# Patient Record
Sex: Female | Born: 1980 | Race: White | Hispanic: No | Marital: Single | Smoking: Never smoker
Health system: Southern US, Community
[De-identification: ages and names within clinical notes are randomized; demographics above are authoritative.]

## PROBLEM LIST (undated history)

## (undated) ENCOUNTER — Inpatient Hospital Stay (HOSPITAL_COMMUNITY): Payer: Self-pay

## (undated) DIAGNOSIS — F329 Major depressive disorder, single episode, unspecified: Secondary | ICD-10-CM

## (undated) DIAGNOSIS — I10 Essential (primary) hypertension: Secondary | ICD-10-CM

## (undated) DIAGNOSIS — K219 Gastro-esophageal reflux disease without esophagitis: Secondary | ICD-10-CM

## (undated) DIAGNOSIS — Z98891 History of uterine scar from previous surgery: Secondary | ICD-10-CM

## (undated) DIAGNOSIS — Z8759 Personal history of other complications of pregnancy, childbirth and the puerperium: Secondary | ICD-10-CM

## (undated) DIAGNOSIS — F32A Depression, unspecified: Secondary | ICD-10-CM

## (undated) DIAGNOSIS — O10919 Unspecified pre-existing hypertension complicating pregnancy, unspecified trimester: Secondary | ICD-10-CM

## (undated) DIAGNOSIS — F419 Anxiety disorder, unspecified: Secondary | ICD-10-CM

## (undated) DIAGNOSIS — N200 Calculus of kidney: Secondary | ICD-10-CM

## (undated) DIAGNOSIS — O34219 Maternal care for unspecified type scar from previous cesarean delivery: Secondary | ICD-10-CM

## (undated) HISTORY — DX: Calculus of kidney: N20.0

---

## 2010-03-10 ENCOUNTER — Ambulatory Visit: Payer: Self-pay | Admitting: Advanced Practice Midwife

## 2010-03-10 ENCOUNTER — Observation Stay (HOSPITAL_COMMUNITY): Admission: AD | Admit: 2010-03-10 | Discharge: 2010-03-12 | Payer: Self-pay | Admitting: Obstetrics and Gynecology

## 2010-03-17 ENCOUNTER — Ambulatory Visit: Payer: Self-pay | Admitting: Family Medicine

## 2010-03-20 ENCOUNTER — Ambulatory Visit: Payer: Self-pay | Admitting: Gynecology

## 2010-03-20 ENCOUNTER — Ambulatory Visit: Payer: Self-pay | Admitting: Obstetrics & Gynecology

## 2010-03-20 ENCOUNTER — Inpatient Hospital Stay (HOSPITAL_COMMUNITY): Admission: AD | Admit: 2010-03-20 | Discharge: 2010-03-20 | Payer: Self-pay | Admitting: Obstetrics and Gynecology

## 2010-03-24 ENCOUNTER — Encounter (INDEPENDENT_AMBULATORY_CARE_PROVIDER_SITE_OTHER): Payer: Self-pay | Admitting: Obstetrics and Gynecology

## 2010-03-24 ENCOUNTER — Ambulatory Visit: Payer: Self-pay | Admitting: Obstetrics & Gynecology

## 2010-03-24 ENCOUNTER — Inpatient Hospital Stay (HOSPITAL_COMMUNITY): Admission: AD | Admit: 2010-03-24 | Discharge: 2010-03-28 | Payer: Self-pay | Admitting: Obstetrics and Gynecology

## 2010-07-28 LAB — COMPREHENSIVE METABOLIC PANEL
ALT: 16 U/L (ref 0–35)
AST: 28 U/L (ref 0–37)
Albumin: 1.9 g/dL — ABNORMAL LOW (ref 3.5–5.2)
Albumin: 2.6 g/dL — ABNORMAL LOW (ref 3.5–5.2)
Albumin: 2.7 g/dL — ABNORMAL LOW (ref 3.5–5.2)
Alkaline Phosphatase: 199 U/L — ABNORMAL HIGH (ref 39–117)
BUN: 6 mg/dL (ref 6–23)
BUN: 7 mg/dL (ref 6–23)
CO2: 22 mEq/L (ref 19–32)
CO2: 25 mEq/L (ref 19–32)
Calcium: 8.6 mg/dL (ref 8.4–10.5)
Chloride: 104 mEq/L (ref 96–112)
Chloride: 105 mEq/L (ref 96–112)
Creatinine, Ser: 0.65 mg/dL (ref 0.4–1.2)
Creatinine, Ser: 0.83 mg/dL (ref 0.4–1.2)
GFR calc Af Amer: >60 mL/min (ref >60)
GFR calc non Af Amer: >60 mL/min (ref >60)
GFR calc non Af Amer: >60 mL/min (ref >60)
Glucose, Bld: 73 mg/dL (ref 70–99)
Potassium: 4.4 mEq/L (ref 3.5–5.1)
Sodium: 134 mEq/L — ABNORMAL LOW (ref 135–145)
Total Bilirubin: 0.2 mg/dL — ABNORMAL LOW (ref 0.3–1.2)
Total Bilirubin: 0.3 mg/dL (ref 0.3–1.2)

## 2010-07-28 LAB — CBC
HCT: 34 % — ABNORMAL LOW (ref 36.0–46.0)
Hemoglobin: 11.1 g/dL — ABNORMAL LOW (ref 12.0–15.0)
MCH: 26.6 pg (ref 26.0–34.0)
MCH: 26.8 pg (ref 26.0–34.0)
MCHC: 32.9 g/dL (ref 30.0–36.0)
MCHC: 33.2 g/dL (ref 30.0–36.0)
MCV: 80.2 fL (ref 78.0–100.0)
MCV: 80.7 fL (ref 78.0–100.0)
Platelets: 263 10*3/uL (ref 150–400)
Platelets: 332 10*3/uL (ref 150–400)
RBC: 3.18 MIL/uL — ABNORMAL LOW (ref 3.87–5.11)
RBC: 4.09 MIL/uL (ref 3.87–5.11)
RBC: 4.24 MIL/uL (ref 3.87–5.11)
WBC: 11.6 10*3/uL — ABNORMAL HIGH (ref 4.0–10.5)

## 2010-07-28 LAB — RPR: RPR Ser Ql: NONREACTIVE

## 2010-07-29 LAB — URINALYSIS, ROUTINE W REFLEX MICROSCOPIC
Glucose, UA: NEGATIVE mg/dL
Hgb urine dipstick: NEGATIVE
Ketones, ur: 15 mg/dL — AB
Protein, ur: 100 mg/dL — AB

## 2010-07-29 LAB — URINE MICROSCOPIC-ADD ON

## 2010-07-29 LAB — CBC
Hemoglobin: 11.3 g/dL — ABNORMAL LOW (ref 12.0–15.0)
MCH: 27.2 pg (ref 26.0–34.0)
MCHC: 33.5 g/dL (ref 30.0–36.0)
MCV: 81.3 fL (ref 78.0–100.0)
Platelets: 299 10*3/uL (ref 150–400)
RBC: 4.21 MIL/uL (ref 3.87–5.11)
RDW: 14.1 % (ref 11.5–15.5)
WBC: 11.2 10*3/uL — ABNORMAL HIGH (ref 4.0–10.5)

## 2010-07-29 LAB — CREATININE CLEARANCE, URINE, 24 HOUR
Collection Interval-CRCL: 24 hours
Creatinine, 24H Ur: 1509 mg/d (ref 700–1800)
Creatinine: 0.58 mg/dL (ref 0.4–1.2)
Urine Total Volume-CRCL: 1900 mL

## 2010-07-29 LAB — COMPREHENSIVE METABOLIC PANEL
ALT: 15 U/L (ref 0–35)
ALT: 17 U/L (ref 0–35)
AST: 20 U/L (ref 0–37)
AST: 24 U/L (ref 0–37)
CO2: 21 mEq/L (ref 19–32)
Calcium: 8.7 mg/dL (ref 8.4–10.5)
Chloride: 107 mEq/L (ref 96–112)
GFR calc Af Amer: >60 mL/min (ref >60)
GFR calc Af Amer: >60 mL/min (ref >60)
GFR calc non Af Amer: >60 mL/min (ref >60)
Sodium: 136 mEq/L (ref 135–145)
Sodium: 137 mEq/L (ref 135–145)
Total Bilirubin: 0.6 mg/dL (ref 0.3–1.2)
Total Protein: 5 g/dL — ABNORMAL LOW (ref 6.0–8.3)

## 2010-07-29 LAB — STREP B DNA PROBE: Strep Group B Ag: POSITIVE

## 2010-07-29 LAB — PROTEIN, URINE, 24 HOUR: Protein, Urine: 13 mg/dL

## 2013-10-15 DIAGNOSIS — F32A Depression, unspecified: Secondary | ICD-10-CM | POA: Insufficient documentation

## 2014-12-23 DIAGNOSIS — I10 Essential (primary) hypertension: Secondary | ICD-10-CM | POA: Insufficient documentation

## 2014-12-23 DIAGNOSIS — K219 Gastro-esophageal reflux disease without esophagitis: Secondary | ICD-10-CM | POA: Insufficient documentation

## 2015-05-18 HISTORY — DX: Maternal care for unspecified type scar from previous cesarean delivery: O34.219

## 2015-07-30 DIAGNOSIS — O169 Unspecified maternal hypertension, unspecified trimester: Secondary | ICD-10-CM | POA: Insufficient documentation

## 2015-07-30 DIAGNOSIS — O3429 Maternal care due to uterine scar from other previous surgery: Secondary | ICD-10-CM | POA: Insufficient documentation

## 2015-07-30 LAB — OB RESULTS CONSOLE RUBELLA ANTIBODY, IGM: RUBELLA: IMMUNE

## 2015-07-30 LAB — OB RESULTS CONSOLE ANTIBODY SCREEN: Antibody Screen: NEGATIVE

## 2015-07-30 LAB — OB RESULTS CONSOLE ABO/RH: RH Type: POSITIVE

## 2015-07-30 LAB — OB RESULTS CONSOLE HIV ANTIBODY (ROUTINE TESTING): HIV: NONREACTIVE

## 2015-07-30 LAB — OB RESULTS CONSOLE GC/CHLAMYDIA
CHLAMYDIA, DNA PROBE: NEGATIVE
Gonorrhea: NEGATIVE

## 2015-07-30 LAB — OB RESULTS CONSOLE RPR: RPR: NONREACTIVE

## 2015-07-30 LAB — OB RESULTS CONSOLE HEPATITIS B SURFACE ANTIGEN: HEP B S AG: NEGATIVE

## 2015-11-07 ENCOUNTER — Other Ambulatory Visit (HOSPITAL_COMMUNITY): Payer: Self-pay | Admitting: Obstetrics and Gynecology

## 2015-11-07 DIAGNOSIS — Z3689 Encounter for other specified antenatal screening: Secondary | ICD-10-CM

## 2015-11-12 ENCOUNTER — Encounter (HOSPITAL_COMMUNITY): Payer: Self-pay | Admitting: Obstetrics and Gynecology

## 2015-11-21 ENCOUNTER — Ambulatory Visit (HOSPITAL_COMMUNITY)
Admission: RE | Admit: 2015-11-21 | Discharge: 2015-11-21 | Disposition: A | Discharge status: Home / Self Care | Payer: BLUE CROSS/BLUE SHIELD | Source: Ambulatory Visit | Attending: Obstetrics and Gynecology | Admitting: Obstetrics and Gynecology

## 2015-11-21 ENCOUNTER — Other Ambulatory Visit (HOSPITAL_COMMUNITY): Payer: Self-pay | Admitting: Obstetrics and Gynecology

## 2015-11-21 DIAGNOSIS — Z3A25 25 weeks gestation of pregnancy: Secondary | ICD-10-CM | POA: Diagnosis not present

## 2015-11-21 DIAGNOSIS — Z3689 Encounter for other specified antenatal screening: Secondary | ICD-10-CM

## 2015-11-21 DIAGNOSIS — O10012 Pre-existing essential hypertension complicating pregnancy, second trimester: Secondary | ICD-10-CM

## 2015-11-21 DIAGNOSIS — O09292 Supervision of pregnancy with other poor reproductive or obstetric history, second trimester: Secondary | ICD-10-CM | POA: Diagnosis not present

## 2015-11-21 DIAGNOSIS — Z1389 Encounter for screening for other disorder: Secondary | ICD-10-CM

## 2015-11-21 DIAGNOSIS — O09212 Supervision of pregnancy with history of pre-term labor, second trimester: Secondary | ICD-10-CM | POA: Insufficient documentation

## 2015-11-21 DIAGNOSIS — O359XX0 Maternal care for (suspected) fetal abnormality and damage, unspecified, not applicable or unspecified: Secondary | ICD-10-CM | POA: Insufficient documentation

## 2015-11-21 DIAGNOSIS — Z36 Encounter for antenatal screening of mother: Secondary | ICD-10-CM | POA: Insufficient documentation

## 2015-11-25 ENCOUNTER — Other Ambulatory Visit (HOSPITAL_COMMUNITY): Payer: Self-pay | Admitting: Obstetrics and Gynecology

## 2015-11-25 DIAGNOSIS — Z3689 Encounter for other specified antenatal screening: Secondary | ICD-10-CM

## 2015-12-30 ENCOUNTER — Encounter (HOSPITAL_COMMUNITY): Payer: Self-pay | Admitting: *Deleted

## 2015-12-31 ENCOUNTER — Ambulatory Visit (HOSPITAL_COMMUNITY)
Admission: RE | Admit: 2015-12-31 | Discharge: 2015-12-31 | Disposition: A | Discharge status: Home / Self Care | Payer: BLUE CROSS/BLUE SHIELD | Source: Ambulatory Visit | Attending: Obstetrics and Gynecology | Admitting: Obstetrics and Gynecology

## 2015-12-31 ENCOUNTER — Encounter (HOSPITAL_COMMUNITY): Payer: Self-pay

## 2015-12-31 VITALS — BP 130/89 | HR 121 | Wt 281.2 lb

## 2015-12-31 DIAGNOSIS — Z3A00 Weeks of gestation of pregnancy not specified: Secondary | ICD-10-CM | POA: Diagnosis not present

## 2015-12-31 DIAGNOSIS — O10919 Unspecified pre-existing hypertension complicating pregnancy, unspecified trimester: Secondary | ICD-10-CM | POA: Insufficient documentation

## 2015-12-31 DIAGNOSIS — Z3689 Encounter for other specified antenatal screening: Secondary | ICD-10-CM

## 2015-12-31 HISTORY — DX: Anxiety disorder, unspecified: F41.9

## 2015-12-31 HISTORY — DX: Major depressive disorder, single episode, unspecified: F32.9

## 2015-12-31 HISTORY — DX: Depression, unspecified: F32.A

## 2015-12-31 HISTORY — DX: Essential (primary) hypertension: I10

## 2016-01-28 ENCOUNTER — Encounter (HOSPITAL_COMMUNITY): Payer: Self-pay

## 2016-01-28 ENCOUNTER — Ambulatory Visit (HOSPITAL_COMMUNITY)
Admission: RE | Admit: 2016-01-28 | Discharge: 2016-01-28 | Disposition: A | Discharge status: Home / Self Care | Payer: BLUE CROSS/BLUE SHIELD | Source: Ambulatory Visit | Attending: Obstetrics and Gynecology | Admitting: Obstetrics and Gynecology

## 2016-01-28 DIAGNOSIS — O09213 Supervision of pregnancy with history of pre-term labor, third trimester: Secondary | ICD-10-CM | POA: Insufficient documentation

## 2016-01-28 DIAGNOSIS — Z3A35 35 weeks gestation of pregnancy: Secondary | ICD-10-CM | POA: Insufficient documentation

## 2016-01-28 DIAGNOSIS — O99213 Obesity complicating pregnancy, third trimester: Secondary | ICD-10-CM | POA: Insufficient documentation

## 2016-01-28 DIAGNOSIS — O10919 Unspecified pre-existing hypertension complicating pregnancy, unspecified trimester: Secondary | ICD-10-CM

## 2016-01-28 DIAGNOSIS — O34219 Maternal care for unspecified type scar from previous cesarean delivery: Secondary | ICD-10-CM | POA: Diagnosis not present

## 2016-01-28 DIAGNOSIS — O10013 Pre-existing essential hypertension complicating pregnancy, third trimester: Secondary | ICD-10-CM | POA: Insufficient documentation

## 2016-01-30 LAB — OB RESULTS CONSOLE GBS: GBS: NEGATIVE

## 2016-02-11 ENCOUNTER — Telehealth (HOSPITAL_COMMUNITY): Payer: Self-pay | Admitting: *Deleted

## 2016-02-11 ENCOUNTER — Encounter (HOSPITAL_COMMUNITY): Payer: Self-pay

## 2016-02-11 NOTE — Telephone Encounter (Signed)
Preadmission screen  

## 2016-02-15 ENCOUNTER — Telehealth (HOSPITAL_COMMUNITY): Payer: Self-pay

## 2016-02-15 ENCOUNTER — Inpatient Hospital Stay (EMERGENCY_DEPARTMENT_HOSPITAL)
Admission: AD | Admit: 2016-02-15 | Discharge: 2016-02-15 | Disposition: A | Discharge status: Home / Self Care | Payer: BLUE CROSS/BLUE SHIELD | Source: Ambulatory Visit | Attending: Obstetrics and Gynecology | Admitting: Obstetrics and Gynecology

## 2016-02-15 ENCOUNTER — Encounter (HOSPITAL_COMMUNITY): Payer: Self-pay | Admitting: Certified Nurse Midwife

## 2016-02-15 DIAGNOSIS — Z3A37 37 weeks gestation of pregnancy: Secondary | ICD-10-CM

## 2016-02-15 DIAGNOSIS — O368130 Decreased fetal movements, third trimester, not applicable or unspecified: Secondary | ICD-10-CM

## 2016-02-15 DIAGNOSIS — O368131 Decreased fetal movements, third trimester, fetus 1: Secondary | ICD-10-CM

## 2016-02-15 DIAGNOSIS — O34219 Maternal care for unspecified type scar from previous cesarean delivery: Secondary | ICD-10-CM | POA: Insufficient documentation

## 2016-02-15 DIAGNOSIS — O10013 Pre-existing essential hypertension complicating pregnancy, third trimester: Secondary | ICD-10-CM

## 2016-02-15 DIAGNOSIS — Z7982 Long term (current) use of aspirin: Secondary | ICD-10-CM

## 2016-02-15 DIAGNOSIS — O34211 Maternal care for low transverse scar from previous cesarean delivery: Secondary | ICD-10-CM | POA: Diagnosis not present

## 2016-02-15 NOTE — MAU Note (Signed)
Pt states she hasn't felt her baby move all afternoon. Pt denies ctxs, vaginal bleeding, LOF.

## 2016-02-15 NOTE — MAU Provider Note (Signed)
Chief Complaint:  Decreased Fetal Movement   First Provider Initiated Contact with Patient 02/15/16 1858     HPI: Stacy Richards is a 35 y.o. G2P0102 at 6235w4d who presents to maternity admissions reporting decreased fetal mvmt today. Pregnancy significant for chronic hypertension on Aldomet and previous C-section with plan for repeat at 38 weeks.  Associated signs and symptoms: Negative for abdominal pain, vaginal bleeding, leaking of fluid.  Past Medical History: Past Medical History:  Diagnosis Date  . Anxiety   . Depression   . GERD (gastroesophageal reflux disease)   . History of pre-eclampsia   . Hypertension     Past obstetric history: OB History  Gravida Para Term Preterm AB Living  2 1   1   2   SAB TAB Ectopic Multiple Live Births        1 2    # Outcome Date GA Lbr Len/2nd Weight Sex Delivery Anes PTL Lv  2 Current           1A Preterm 2011 1885w0d  5 lb 2 oz (2.325 kg) F CS-Unspec   LIV  1B Preterm 2011 8185w0d  5 lb 5 oz (2.41 kg) F CS-Unspec   LIV      Past Surgical History: Past Surgical History:  Procedure Laterality Date  . CESAREAN SECTION       Family History: Family History  Problem Relation Age of Onset  . Hypertension Mother   . Hypertension Father   . COPD Paternal Uncle     lung  . Hypertension Maternal Grandmother   . Skin cancer Maternal Grandmother   . Hypertension Maternal Grandfather   . Hypertension Paternal Grandmother   . Skin cancer Paternal Grandmother   . Hypertension Paternal Grandfather   . COPD Paternal Grandfather     lung    Social History: Social History  Substance Use Topics  . Smoking status: Never Smoker  . Smokeless tobacco: Never Used  . Alcohol use No    Allergies: No Known Allergies  Meds:  Prescriptions Prior to Admission  Medication Sig Dispense Refill Last Dose  . aspirin 81 MG tablet Take 81 mg by mouth daily.   Taking  . diphenhydramine-acetaminophen (TYLENOL PM) 25-500 MG TABS tablet Take 2 tablets by  mouth at bedtime as needed (For sleep.).     Marland Kitchen. esomeprazole (NEXIUM) 40 MG capsule Take 40 mg by mouth daily.      . methyldopa (ALDOMET) 250 MG tablet Take 500 mg by mouth 2 (two) times daily.      . Prenatal Vit-Fe Fumarate-FA (PRENATAL MULTIVITAMIN) TABS tablet Take 1 tablet by mouth daily at 12 noon.     . venlafaxine XR (EFFEXOR-XR) 75 MG 24 hr capsule Take 75 mg by mouth 2 (two) times daily.        I have reviewed patient's Past Medical Hx, Surgical Hx, Family Hx, Social Hx, medications and allergies.   ROS:  Review of Systems  Gastrointestinal: Negative for abdominal pain.  Genitourinary: Negative for vaginal bleeding.       Negative for leaking of fluid. Positive for decreased fetal movement.    Physical Exam  Patient Vitals for the past 24 hrs:  BP Temp Temp src Pulse Resp  02/15/16 1840 139/88 - - - -  02/15/16 1837 - 97.9 F (36.6 C) Oral 94 22   Constitutional: Well-developed, well-nourished. obese female in no acute distress.  Cardiovascular: normal rate Respiratory: normal effort GI: Abd soft, non-tender, gravid, size>dates MS: Extremities nontender,  1+ edema, normal ROM Neurologic: Alert and oriented x 4.  GU: Deferred    FHT:  Baseline 155 , moderate variability, accelerations present, no decelerations Contractions: Irregular, painless   Labs: No results found for this or any previous visit (from the past 24 hour(s)).  Imaging:  NA  MAU Course: NST reactive. Patient feeling normal fetal movement while in MAU.  Discussed history, NST with Dr. Senaida Ores. Patient okay for discharge.  MDM: - Decreased fetal movement resolved. NST reactive. Patient has chronic hypertension, normal today on Aldomet.  Assessment: 1. Decreased fetal movement, third trimester, fetus 1   Chronic hypertension pregnancy third trimester  Plan: Discharge home in stable condition per consult with Dr. Senaida Ores.  Labor precautions and fetal kick counts Follow-up Information     [facility] .   Why:  as scheduled or as needed of symptoms worsen Contact information: [address]  [address] [place] [address] [phone]        [facility] .   Why:  as needed if symptoms worsen  Contact information: [address] [id number] [place] [address] [phone]            Medication List    TAKE these medications   aspirin 81 MG tablet Take 81 mg by mouth daily.   diphenhydramine-acetaminophen 25-500 MG Tabs tablet Commonly known as:  TYLENOL PM Take 2 tablets by mouth at bedtime as needed (For sleep.).   esomeprazole 40 MG capsule Commonly known as:  NEXIUM Take 40 mg by mouth daily.   methyldopa 250 MG tablet Commonly known as:  ALDOMET Take 500 mg by mouth 2 (two) times daily.   prenatal multivitamin Tabs tablet Take 1 tablet by mouth daily at 12 noon.   venlafaxine XR 75 MG 24 hr capsule Commonly known as:  EFFEXOR-XR Take 75 mg by mouth 2 (two) times daily.       [place], CNM 02/15/2016 7:24 PM

## 2016-02-15 NOTE — Discharge Instructions (Signed)
Fetal Movement Counts  Patient Name: __________________________________________________ Patient Due Date: ____________________  Performing a fetal movement count is highly recommended in high-risk pregnancies, but it is good for every pregnant woman to do. Your health care provider may ask you to start counting fetal movements at 28 weeks of the pregnancy. Fetal movements often increase:  · After eating a full meal.  · After physical activity.  · After eating or drinking something sweet or cold.  · At rest.  Pay attention to when you feel the baby is most active. This will help you notice a pattern of your baby's sleep and wake cycles and what factors contribute to an increase in fetal movement. It is important to perform a fetal movement count at the same time each day when your baby is normally most active.   HOW TO COUNT FETAL MOVEMENTS  1. Find a quiet and comfortable area to sit or lie down on your left side. Lying on your left side provides the best blood and oxygen circulation to your baby.  2. Write down the day and time on a sheet of paper or in a journal.  3. Start counting kicks, flutters, swishes, rolls, or jabs in a 2-hour period. You should feel at least 10 movements within 2 hours.  4. If you do not feel 10 movements in 2 hours, wait 2-3 hours and count again. Look for a change in the pattern or not enough counts in 2 hours.  SEEK MEDICAL CARE IF:  · You feel less than 10 counts in 2 hours, tried twice.  · There is no movement in over an hour.  · The pattern is changing or taking longer each day to reach 10 counts in 2 hours.  · You feel the baby is not moving as he or she usually does.  Date: ____________ Movements: ____________ Start time: ____________ Finish time: ____________   Date: ____________ Movements: ____________ Start time: ____________ Finish time: ____________  Date: ____________ Movements: ____________ Start time: ____________ Finish time: ____________  Date: ____________ Movements:  ____________ Start time: ____________ Finish time: ____________  Date: ____________ Movements: ____________ Start time: ____________ Finish time: ____________  Date: ____________ Movements: ____________ Start time: ____________ Finish time: ____________  Date: ____________ Movements: ____________ Start time: ____________ Finish time: ____________  Date: ____________ Movements: ____________ Start time: ____________ Finish time: ____________   Date: ____________ Movements: ____________ Start time: ____________ Finish time: ____________  Date: ____________ Movements: ____________ Start time: ____________ Finish time: ____________  Date: ____________ Movements: ____________ Start time: ____________ Finish time: ____________  Date: ____________ Movements: ____________ Start time: ____________ Finish time: ____________  Date: ____________ Movements: ____________ Start time: ____________ Finish time: ____________  Date: ____________ Movements: ____________ Start time: ____________ Finish time: ____________  Date: ____________ Movements: ____________ Start time: ____________ Finish time: ____________   Date: ____________ Movements: ____________ Start time: ____________ Finish time: ____________  Date: ____________ Movements: ____________ Start time: ____________ Finish time: ____________  Date: ____________ Movements: ____________ Start time: ____________ Finish time: ____________  Date: ____________ Movements: ____________ Start time: ____________ Finish time: ____________  Date: ____________ Movements: ____________ Start time: ____________ Finish time: ____________  Date: ____________ Movements: ____________ Start time: ____________ Finish time: ____________  Date: ____________ Movements: ____________ Start time: ____________ Finish time: ____________   Date: ____________ Movements: ____________ Start time: ____________ Finish time: ____________  Date: ____________ Movements: ____________ Start time: ____________ Finish  time: ____________  Date: ____________ Movements: ____________ Start time: ____________ Finish time: ____________  Date: ____________ Movements: ____________ Start time:   ____________ Finish time: ____________  Date: ____________ Movements: ____________ Start time: ____________ Finish time: ____________  Date: ____________ Movements: ____________ Start time: ____________ Finish time: ____________  Date: ____________ Movements: ____________ Start time: ____________ Finish time: ____________   Date: ____________ Movements: ____________ Start time: ____________ Finish time: ____________  Date: ____________ Movements: ____________ Start time: ____________ Finish time: ____________  Date: ____________ Movements: ____________ Start time: ____________ Finish time: ____________  Date: ____________ Movements: ____________ Start time: ____________ Finish time: ____________  Date: ____________ Movements: ____________ Start time: ____________ Finish time: ____________  Date: ____________ Movements: ____________ Start time: ____________ Finish time: ____________  Date: ____________ Movements: ____________ Start time: ____________ Finish time: ____________   Date: ____________ Movements: ____________ Start time: ____________ Finish time: ____________  Date: ____________ Movements: ____________ Start time: ____________ Finish time: ____________  Date: ____________ Movements: ____________ Start time: ____________ Finish time: ____________  Date: ____________ Movements: ____________ Start time: ____________ Finish time: ____________  Date: ____________ Movements: ____________ Start time: ____________ Finish time: ____________  Date: ____________ Movements: ____________ Start time: ____________ Finish time: ____________  Date: ____________ Movements: ____________ Start time: ____________ Finish time: ____________   Date: ____________ Movements: ____________ Start time: ____________ Finish time: ____________  Date: ____________  Movements: ____________ Start time: ____________ Finish time: ____________  Date: ____________ Movements: ____________ Start time: ____________ Finish time: ____________  Date: ____________ Movements: ____________ Start time: ____________ Finish time: ____________  Date: ____________ Movements: ____________ Start time: ____________ Finish time: ____________  Date: ____________ Movements: ____________ Start time: ____________ Finish time: ____________  Date: ____________ Movements: ____________ Start time: ____________ Finish time: ____________   Date: ____________ Movements: ____________ Start time: ____________ Finish time: ____________  Date: ____________ Movements: ____________ Start time: ____________ Finish time: ____________  Date: ____________ Movements: ____________ Start time: ____________ Finish time: ____________  Date: ____________ Movements: ____________ Start time: ____________ Finish time: ____________  Date: ____________ Movements: ____________ Start time: ____________ Finish time: ____________  Date: ____________ Movements: ____________ Start time: ____________ Finish time: ____________     This information is not intended to replace advice given to you by your health care provider. Make sure you discuss any questions you have with your health care provider.     Document Released: 06/02/2006 Document Revised: 05/24/2014 Document Reviewed: 02/28/2012  Elsevier Interactive Patient Education ©2016 [employer] Inc[employer]

## 2016-02-17 ENCOUNTER — Encounter (HOSPITAL_COMMUNITY): Payer: Self-pay | Admitting: Obstetrics and Gynecology

## 2016-02-17 ENCOUNTER — Encounter (HOSPITAL_COMMUNITY)
Admission: RE | Admit: 2016-02-17 | Discharge: 2016-02-17 | Disposition: A | Discharge status: Home / Self Care | Payer: BLUE CROSS/BLUE SHIELD | Source: Ambulatory Visit | Attending: Obstetrics and Gynecology | Admitting: Obstetrics and Gynecology

## 2016-02-17 DIAGNOSIS — O10919 Unspecified pre-existing hypertension complicating pregnancy, unspecified trimester: Secondary | ICD-10-CM

## 2016-02-17 DIAGNOSIS — O34219 Maternal care for unspecified type scar from previous cesarean delivery: Secondary | ICD-10-CM

## 2016-02-17 HISTORY — DX: Unspecified pre-existing hypertension complicating pregnancy, unspecified trimester: O10.919

## 2016-02-17 HISTORY — DX: Personal history of other complications of pregnancy, childbirth and the puerperium: Z87.59

## 2016-02-17 HISTORY — DX: Gastro-esophageal reflux disease without esophagitis: K21.9

## 2016-02-17 LAB — BASIC METABOLIC PANEL
ANION GAP: 8 (ref 5–15)
BUN: 7 mg/dL (ref 6–20)
CALCIUM: 9.3 mg/dL (ref 8.9–10.3)
CO2: 24 mmol/L (ref 22–32)
CREATININE: 0.73 mg/dL (ref 0.44–1.00)
Chloride: 104 mmol/L (ref 101–111)
Glucose, Bld: 79 mg/dL (ref 65–99)
Potassium: 4.5 mmol/L (ref 3.5–5.1)
Sodium: 136 mmol/L (ref 135–145)

## 2016-02-17 LAB — CBC
HEMATOCRIT: 33.1 % — AB (ref 36.0–46.0)
HEMOGLOBIN: 10.8 g/dL — AB (ref 12.0–15.0)
MCH: 26.3 pg (ref 26.0–34.0)
MCHC: 32.6 g/dL (ref 30.0–36.0)
MCV: 80.5 fL (ref 78.0–100.0)
Platelets: 347 10*3/uL (ref 150–400)
RBC: 4.11 MIL/uL (ref 3.87–5.11)
RDW: 13.9 % (ref 11.5–15.5)
WBC: 8.9 10*3/uL (ref 4.0–10.5)

## 2016-02-17 LAB — ABO/RH: ABO/RH(D): A POS

## 2016-02-17 NOTE — H&P (Signed)
Stacy Richards is a 35 y.o. 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  Also  h/o LTCS - Otherwise pregnancy complicated by maternal obesity and depression/anxiety on Effexor..   pt desires rLTCS d/w pt r/b/a   OB History    Gravida Para Term Preterm AB Living   2 1   1   2    SAB TAB Ectopic Multiple Live Births         1 2    G1 twins - 36wk LTCS, br/br.  Also LSO for  G2 present No abn pap, last 11/16 No STD  Past Medical History:  Diagnosis Date  . Anxiety   . Depression   . GERD (gastroesophageal reflux disease)   . History of cesarean section complicating pregnancy 02/17/2016  . History of pre-eclampsia   . HTN in pregnancy, chronic 02/17/2016  . Hypertension    Past Surgical History:  Procedure Laterality Date  . CESAREAN SECTION    with LSO for dermoid.    History: family history includes COPD in her paternal grandfather and paternal uncle; Hypertension in her father, maternal grandfather, maternal grandmother, mother, paternal grandfather, and paternal grandmother; Skin cancer in her maternal grandmother and paternal grandmother. Social History:  reports that she has never smoked. She has never used smokeless tobacco. She reports that she does not drink alcohol or use drugs. engaged, SAHM Meds esompeprazole, methyldopa, effexor and PNV All NKDA     Maternal Diabetes: No Genetic Screening: Declined Maternal Ultrasounds/Referrals: Normal Fetal Ultrasounds or other Referrals:  None Maternal Substance Abuse:  No Significant Maternal Medications:  Meds include: Other: Effexor, Esompeprazole Significant Maternal Lab Results:  Lab values include: Group B Strep negative Other Comments:  h/o PIH,   Review of Systems  Constitutional: Negative.   Eyes: Negative.   Respiratory: Negative.   Cardiovascular: Negative.   Gastrointestinal: Negative.   Genitourinary: Negative.   Musculoskeletal: Positive for back pain.  Skin:  Negative.   Neurological: Positive for headaches (occasional; h/o PIH).  Psychiatric/Behavioral: Negative.    Maternal Medical History:  Contractions: Frequency: irregular.   Perceived severity is moderate.    Fetal activity: Perceived fetal activity is normal.    Prenatal complications: PIH.   Prenatal Complications - Diabetes: none.      Last menstrual period 05/28/2015. Maternal Exam:  Uterine Assessment: Contraction frequency is irregular.   Abdomen: Patient reports no abdominal tenderness. Surgical scars: low transverse.   Fundal height is appropriate for gestation.   Estimated fetal weight is 8#.   Fetal presentation: vertex  Introitus: Normal vulva. Normal vagina.    Physical Exam  Constitutional: She is oriented to person, place, and time. She appears well-developed and well-nourished.  obese  HENT:  Head: Normocephalic and atraumatic.  Cardiovascular: Normal rate and regular rhythm.   Respiratory: Effort normal and breath sounds normal. No respiratory distress. She has no wheezes.  GI: Soft. Bowel sounds are normal. She exhibits no distension. There is no tenderness.  Musculoskeletal: Normal range of motion.  Neurological: She is alert and oriented to person, place, and time.  Skin: Skin is warm and dry.  Psychiatric: She has a normal mood and affect. Her behavior is normal.    Prenatal labs: ABO, Rh: --/--/A POS (10/03 1010) Antibody: NEG (10/03 1010) Rubella: Immune (03/15 0000) RPR: Nonreactive (03/15 0000)  HBsAg: Negative (03/15 0000)  HIV: Non-reactive (03/15 0000)  GBS: Negative (09/15 0000)   Hgb 12.1/Plt 414/Ur Cx neg/GC  neg/Chl neg/ CF neg/SMA neg/Fragile X neg/glucola 108  Multiple [place] - good growth, nl anat, post plac, female Assessment/Plan: 34yo G2P0102 at 38+ for rLTCS given h/o LTCS and CHTN/PIH D/w pt rLTCS, inc r/b/a Ancef for prophylaxis    Bovard-Stuckert, Elyzabeth Goatley 02/17/2016, 3:33 PM

## 2016-02-17 NOTE — Patient Instructions (Signed)
20 [name]  02/17/2016   Your procedure is scheduled on:  02/18/2016  [facility] at0800 AM.  Pick up the phone at the desk and dial [phone].   Call this number if you have problems the morning of surgery: [phone]   Remember:   Do not eat food:After Midnight.  Do not drink clear liquids: After Midnight.  Take these medicines the morning of surgery with A SIP OF WATER: take effexor, aldomet and nexium   Do not wear jewelry, make-up or nail polish.  Do not wear lotions, powders, or perfumes. Do not wear deodorant.  Do not shave 48 hours prior to surgery.  Do not bring valuables to the hospital.  [facility] is not   responsible for any belongings or valuables brought to the hospital.  Contacts, dentures or bridgework may not be worn into surgery.  Leave suitcase in the car. After surgery it may be brought to your room.  For patients admitted to the hospital, checkout time is 11:00 AM the day of              discharge.   Patients discharged the day of surgery will not be allowed to drive             home.  Name and phone number of your driver: none  Special Instructions:   N/A   Please read over the following fact sheets that you were given:   Surgical Site Infection Prevention

## 2016-02-18 ENCOUNTER — Inpatient Hospital Stay (HOSPITAL_COMMUNITY): Payer: BLUE CROSS/BLUE SHIELD | Admitting: Anesthesiology

## 2016-02-18 ENCOUNTER — Inpatient Hospital Stay (HOSPITAL_COMMUNITY)
Admission: RE | Admit: 2016-02-18 | Discharge: 2016-02-20 | DRG: 765 | Disposition: A | Discharge status: Home / Self Care | Payer: BLUE CROSS/BLUE SHIELD | Source: Ambulatory Visit | Attending: Obstetrics and Gynecology | Admitting: Obstetrics and Gynecology

## 2016-02-18 ENCOUNTER — Encounter (HOSPITAL_COMMUNITY)
Admission: RE | Disposition: A | Discharge status: Home / Self Care | Payer: Self-pay | Source: Ambulatory Visit | Attending: Obstetrics and Gynecology

## 2016-02-18 ENCOUNTER — Encounter (HOSPITAL_COMMUNITY): Payer: Self-pay

## 2016-02-18 DIAGNOSIS — O34211 Maternal care for low transverse scar from previous cesarean delivery: Principal | ICD-10-CM | POA: Diagnosis present

## 2016-02-18 DIAGNOSIS — O10919 Unspecified pre-existing hypertension complicating pregnancy, unspecified trimester: Secondary | ICD-10-CM | POA: Diagnosis present

## 2016-02-18 DIAGNOSIS — Z825 Family history of asthma and other chronic lower respiratory diseases: Secondary | ICD-10-CM

## 2016-02-18 DIAGNOSIS — O34219 Maternal care for unspecified type scar from previous cesarean delivery: Secondary | ICD-10-CM | POA: Diagnosis present

## 2016-02-18 DIAGNOSIS — Z98891 History of uterine scar from previous surgery: Secondary | ICD-10-CM

## 2016-02-18 DIAGNOSIS — K219 Gastro-esophageal reflux disease without esophagitis: Secondary | ICD-10-CM | POA: Diagnosis present

## 2016-02-18 DIAGNOSIS — O9962 Diseases of the digestive system complicating childbirth: Secondary | ICD-10-CM | POA: Diagnosis present

## 2016-02-18 DIAGNOSIS — O1092 Unspecified pre-existing hypertension complicating childbirth: Secondary | ICD-10-CM | POA: Diagnosis present

## 2016-02-18 DIAGNOSIS — Z3A38 38 weeks gestation of pregnancy: Secondary | ICD-10-CM | POA: Diagnosis not present

## 2016-02-18 HISTORY — DX: History of uterine scar from previous surgery: Z98.891

## 2016-02-18 HISTORY — DX: Unspecified pre-existing hypertension complicating pregnancy, unspecified trimester: O10.919

## 2016-02-18 LAB — PREPARE RBC (CROSSMATCH)

## 2016-02-18 LAB — RPR: RPR: NONREACTIVE

## 2016-02-18 SURGERY — Surgical Case
Anesthesia: Spinal

## 2016-02-18 MED ORDER — PANTOPRAZOLE SODIUM 40 MG PO TBEC
40.0000 mg | DELAYED_RELEASE_TABLET | Freq: Every day | ORAL | Status: DC
  Administered 2016-02-19: 40 mg via ORAL
  Filled 2016-02-18 (×2): qty 1

## 2016-02-18 MED ORDER — DEXAMETHASONE SODIUM PHOSPHATE 10 MG/ML IJ SOLN
INTRAMUSCULAR | Status: AC
  Filled 2016-02-18: qty 1

## 2016-02-18 MED ORDER — OXYTOCIN 10 UNIT/ML IJ SOLN
INTRAVENOUS | Status: DC | PRN
  Administered 2016-02-18: 40 [IU] via INTRAVENOUS

## 2016-02-18 MED ORDER — PHENYLEPHRINE 8 MG IN D5W 100 ML (0.08MG/ML) PREMIX OPTIME
INJECTION | INTRAVENOUS | Status: DC | PRN
  Administered 2016-02-18: 40 ug/min via INTRAVENOUS

## 2016-02-18 MED ORDER — SIMETHICONE 80 MG PO CHEW
80.0000 mg | CHEWABLE_TABLET | Freq: Three times a day (TID) | ORAL | Status: DC
  Administered 2016-02-18 – 2016-02-20 (×5): 80 mg via ORAL
  Filled 2016-02-18 (×5): qty 1

## 2016-02-18 MED ORDER — ONDANSETRON HCL 4 MG/2ML IJ SOLN
INTRAMUSCULAR | Status: DC | PRN
  Administered 2016-02-18: 4 mg via INTRAVENOUS

## 2016-02-18 MED ORDER — DIPHENHYDRAMINE HCL 25 MG PO CAPS: 25.0000 mg | ORAL_CAPSULE | Freq: Four times a day (QID) | ORAL | Status: DC | PRN

## 2016-02-18 MED ORDER — CEFAZOLIN SODIUM 10 G IJ SOLR
3.0000 g | INTRAMUSCULAR | Status: AC
  Administered 2016-02-18: 3 g via INTRAVENOUS
  Filled 2016-02-18: qty 3000

## 2016-02-18 MED ORDER — LACTATED RINGERS IV SOLN: INTRAVENOUS | Status: DC

## 2016-02-18 MED ORDER — BUPIVACAINE IN DEXTROSE 0.75-8.25 % IT SOLN
INTRATHECAL | Status: DC | PRN
  Administered 2016-02-18: 1.6 mL via INTRATHECAL

## 2016-02-18 MED ORDER — KETOROLAC TROMETHAMINE 30 MG/ML IJ SOLN
INTRAMUSCULAR | Status: AC
  Filled 2016-02-18: qty 1

## 2016-02-18 MED ORDER — SIMETHICONE 80 MG PO CHEW
80.0000 mg | CHEWABLE_TABLET | ORAL | Status: DC
  Administered 2016-02-18 – 2016-02-19 (×2): 80 mg via ORAL
  Filled 2016-02-18 (×2): qty 1

## 2016-02-18 MED ORDER — OXYTOCIN 40 UNITS IN LACTATED RINGERS INFUSION - SIMPLE MED: 2.5000 [IU]/h | INTRAVENOUS | Status: AC

## 2016-02-18 MED ORDER — FENTANYL CITRATE (PF) 100 MCG/2ML IJ SOLN
INTRAMUSCULAR | Status: AC
  Filled 2016-02-18: qty 2

## 2016-02-18 MED ORDER — MORPHINE SULFATE-NACL 0.5-0.9 MG/ML-% IV SOSY
PREFILLED_SYRINGE | INTRAVENOUS | Status: AC
  Filled 2016-02-18: qty 1

## 2016-02-18 MED ORDER — PROMETHAZINE HCL 25 MG/ML IJ SOLN: 6.2500 mg | INTRAMUSCULAR | Status: DC | PRN

## 2016-02-18 MED ORDER — TETANUS-DIPHTH-ACELL PERTUSSIS 5-2.5-18.5 LF-MCG/0.5 IM SUSP: 0.5000 mL | Freq: Once | INTRAMUSCULAR | Status: DC

## 2016-02-18 MED ORDER — SCOPOLAMINE 1 MG/3DAYS TD PT72
MEDICATED_PATCH | TRANSDERMAL | Status: AC
  Administered 2016-02-18: 1.5 mg via TRANSDERMAL
  Filled 2016-02-18: qty 1

## 2016-02-18 MED ORDER — DEXAMETHASONE SODIUM PHOSPHATE 10 MG/ML IJ SOLN
INTRAMUSCULAR | Status: DC | PRN
  Administered 2016-02-18: 10 mg via INTRAVENOUS

## 2016-02-18 MED ORDER — SENNOSIDES-DOCUSATE SODIUM 8.6-50 MG PO TABS
2.0000 | ORAL_TABLET | ORAL | Status: DC
  Administered 2016-02-18: 2 via ORAL
  Filled 2016-02-18 (×2): qty 2

## 2016-02-18 MED ORDER — NALOXONE HCL 0.4 MG/ML IJ SOLN: 0.4000 mg | INTRAMUSCULAR | Status: DC | PRN

## 2016-02-18 MED ORDER — SODIUM CHLORIDE 0.9% FLUSH: 3.0000 mL | INTRAVENOUS | Status: DC | PRN

## 2016-02-18 MED ORDER — DIPHENHYDRAMINE HCL 50 MG/ML IJ SOLN: 12.5000 mg | INTRAMUSCULAR | Status: DC | PRN

## 2016-02-18 MED ORDER — METHYLDOPA 500 MG PO TABS
500.0000 mg | ORAL_TABLET | Freq: Two times a day (BID) | ORAL | Status: DC
  Administered 2016-02-18 – 2016-02-20 (×4): 500 mg via ORAL
  Filled 2016-02-18 (×4): qty 1

## 2016-02-18 MED ORDER — PHENYLEPHRINE 8 MG IN D5W 100 ML (0.08MG/ML) PREMIX OPTIME
INJECTION | INTRAVENOUS | Status: AC
  Filled 2016-02-18: qty 100

## 2016-02-18 MED ORDER — IBUPROFEN 800 MG PO TABS
800.0000 mg | ORAL_TABLET | Freq: Three times a day (TID) | ORAL | Status: DC
  Administered 2016-02-18 – 2016-02-20 (×5): 800 mg via ORAL
  Filled 2016-02-18 (×5): qty 1

## 2016-02-18 MED ORDER — MEPERIDINE HCL 25 MG/ML IJ SOLN: 6.2500 mg | INTRAMUSCULAR | Status: DC | PRN

## 2016-02-18 MED ORDER — HYDROMORPHONE HCL 1 MG/ML IJ SOLN: 0.2500 mg | INTRAMUSCULAR | Status: DC | PRN

## 2016-02-18 MED ORDER — PRENATAL MULTIVITAMIN CH
1.0000 | ORAL_TABLET | Freq: Every day | ORAL | Status: DC
  Filled 2016-02-18: qty 1

## 2016-02-18 MED ORDER — LACTATED RINGERS IV SOLN
INTRAVENOUS | Status: DC
  Administered 2016-02-18 (×3): via INTRAVENOUS

## 2016-02-18 MED ORDER — WITCH HAZEL-GLYCERIN EX PADS: 1.0000 "application " | MEDICATED_PAD | CUTANEOUS | Status: DC | PRN

## 2016-02-18 MED ORDER — KETOROLAC TROMETHAMINE 30 MG/ML IJ SOLN
30.0000 mg | Freq: Four times a day (QID) | INTRAMUSCULAR | Status: DC | PRN
  Administered 2016-02-18: 30 mg via INTRAMUSCULAR

## 2016-02-18 MED ORDER — DIBUCAINE 1 % RE OINT: 1.0000 "application " | TOPICAL_OINTMENT | RECTAL | Status: DC | PRN

## 2016-02-18 MED ORDER — LACTATED RINGERS IV SOLN
INTRAVENOUS | Status: DC
  Administered 2016-02-18: 21:00:00 via INTRAVENOUS

## 2016-02-18 MED ORDER — DIPHENHYDRAMINE HCL 25 MG PO CAPS: 25.0000 mg | ORAL_CAPSULE | ORAL | Status: DC | PRN

## 2016-02-18 MED ORDER — COCONUT OIL OIL: 1.0000 "application " | TOPICAL_OIL | Status: DC | PRN

## 2016-02-18 MED ORDER — SCOPOLAMINE 1 MG/3DAYS TD PT72
1.0000 | MEDICATED_PATCH | Freq: Once | TRANSDERMAL | Status: DC
  Administered 2016-02-18: 1.5 mg via TRANSDERMAL

## 2016-02-18 MED ORDER — NALOXONE HCL 2 MG/2ML IJ SOSY
1.0000 ug/kg/h | PREFILLED_SYRINGE | INTRAMUSCULAR | Status: DC | PRN
  Filled 2016-02-18: qty 2

## 2016-02-18 MED ORDER — SCOPOLAMINE 1 MG/3DAYS TD PT72
1.0000 | MEDICATED_PATCH | Freq: Once | TRANSDERMAL | Status: DC
  Filled 2016-02-18: qty 1

## 2016-02-18 MED ORDER — NALBUPHINE HCL 10 MG/ML IJ SOLN: 5.0000 mg | INTRAMUSCULAR | Status: DC | PRN

## 2016-02-18 MED ORDER — SIMETHICONE 80 MG PO CHEW: 80.0000 mg | CHEWABLE_TABLET | ORAL | Status: DC | PRN

## 2016-02-18 MED ORDER — NALBUPHINE HCL 10 MG/ML IJ SOLN: 5.0000 mg | Freq: Once | INTRAMUSCULAR | Status: DC | PRN

## 2016-02-18 MED ORDER — LACTATED RINGERS IV SOLN
INTRAVENOUS | Status: DC | PRN
  Administered 2016-02-18: 10:00:00 via INTRAVENOUS

## 2016-02-18 MED ORDER — ZOLPIDEM TARTRATE 5 MG PO TABS: 5.0000 mg | ORAL_TABLET | Freq: Every evening | ORAL | Status: DC | PRN

## 2016-02-18 MED ORDER — OXYCODONE HCL 5 MG PO TABS: 10.0000 mg | ORAL_TABLET | ORAL | Status: DC | PRN

## 2016-02-18 MED ORDER — VENLAFAXINE HCL ER 75 MG PO CP24
75.0000 mg | ORAL_CAPSULE | Freq: Two times a day (BID) | ORAL | Status: DC
  Administered 2016-02-18 – 2016-02-20 (×4): 75 mg via ORAL
  Filled 2016-02-18 (×4): qty 1

## 2016-02-18 MED ORDER — FENTANYL CITRATE (PF) 100 MCG/2ML IJ SOLN
INTRAMUSCULAR | Status: DC | PRN
  Administered 2016-02-18 (×2): 50 ug via INTRAVENOUS

## 2016-02-18 MED ORDER — ONDANSETRON HCL 4 MG/2ML IJ SOLN: 4.0000 mg | Freq: Three times a day (TID) | INTRAMUSCULAR | Status: DC | PRN

## 2016-02-18 MED ORDER — ONDANSETRON HCL 4 MG/2ML IJ SOLN
INTRAMUSCULAR | Status: AC
  Filled 2016-02-18: qty 2

## 2016-02-18 MED ORDER — OXYTOCIN 10 UNIT/ML IJ SOLN
INTRAMUSCULAR | Status: AC
  Filled 2016-02-18: qty 4

## 2016-02-18 MED ORDER — ACETAMINOPHEN 325 MG PO TABS
650.0000 mg | ORAL_TABLET | ORAL | Status: DC | PRN
  Administered 2016-02-18 – 2016-02-19 (×2): 650 mg via ORAL
  Filled 2016-02-18 (×2): qty 2

## 2016-02-18 MED ORDER — MORPHINE SULFATE (PF) 0.5 MG/ML IJ SOLN
INTRAMUSCULAR | Status: DC | PRN
  Administered 2016-02-18: .2 mg via EPIDURAL
  Administered 2016-02-18: .3 mg via INTRAVENOUS

## 2016-02-18 MED ORDER — MENTHOL 3 MG MT LOZG: 1.0000 | LOZENGE | OROMUCOSAL | Status: DC | PRN

## 2016-02-18 MED ORDER — KETOROLAC TROMETHAMINE 30 MG/ML IJ SOLN: 30.0000 mg | Freq: Four times a day (QID) | INTRAMUSCULAR | Status: DC | PRN

## 2016-02-18 MED ORDER — LACTATED RINGERS IV SOLN
Freq: Once | INTRAVENOUS | Status: AC
  Administered 2016-02-18: 1000 mL/h via INTRAVENOUS

## 2016-02-18 MED ORDER — OXYCODONE HCL 5 MG PO TABS
5.0000 mg | ORAL_TABLET | ORAL | Status: DC | PRN
  Administered 2016-02-19 – 2016-02-20 (×3): 5 mg via ORAL
  Filled 2016-02-18 (×3): qty 1

## 2016-02-18 SURGICAL SUPPLY — 39 items
BENZOIN TINCTURE PRP APPL 2/3 (GAUZE/BANDAGES/DRESSINGS) ×3 IMPLANT
CHLORAPREP W/TINT 26ML (MISCELLANEOUS) ×3 IMPLANT
CLAMP CORD UMBIL (MISCELLANEOUS) IMPLANT
CLOSURE WOUND 1/2 X4 (GAUZE/BANDAGES/DRESSINGS) ×1
CLOTH BEACON ORANGE TIMEOUT ST (SAFETY) ×3 IMPLANT
CONTAINER PREFILL 10% NBF 15ML (MISCELLANEOUS) IMPLANT
DRSG OPSITE POSTOP 4X10 (GAUZE/BANDAGES/DRESSINGS) ×3 IMPLANT
ELECT REM PT RETURN 9FT ADLT (ELECTROSURGICAL) ×3
ELECTRODE REM PT RTRN 9FT ADLT (ELECTROSURGICAL) ×1 IMPLANT
EXTRACTOR VACUUM M CUP 4 TUBE (SUCTIONS) IMPLANT
EXTRACTOR VACUUM M CUP 4' TUBE (SUCTIONS)
GLOVE BIO SURGEON STRL SZ 6.5 (GLOVE) ×2 IMPLANT
GLOVE BIO SURGEONS STRL SZ 6.5 (GLOVE) ×1
GLOVE BIOGEL PI IND STRL 7.0 (GLOVE) ×1 IMPLANT
GLOVE BIOGEL PI INDICATOR 7.0 (GLOVE) ×2
GOWN STRL REUS W/TWL LRG LVL3 (GOWN DISPOSABLE) ×6 IMPLANT
KIT ABG SYR 3ML LUER SLIP (SYRINGE) IMPLANT
NEEDLE HYPO 25X5/8 SAFETYGLIDE (NEEDLE) IMPLANT
NS IRRIG 1000ML POUR BTL (IV SOLUTION) ×3 IMPLANT
PACK C SECTION WH (CUSTOM PROCEDURE TRAY) ×3 IMPLANT
PAD OB MATERNITY 4.3X12.25 (PERSONAL CARE ITEMS) ×3 IMPLANT
PENCIL SMOKE EVAC W/HOLSTER (ELECTROSURGICAL) ×3 IMPLANT
RTRCTR C-SECT PINK 25CM LRG (MISCELLANEOUS) ×3 IMPLANT
SPONGE LAP 18X18 X RAY DECT (DISPOSABLE) ×6 IMPLANT
STRIP CLOSURE SKIN 1/2X4 (GAUZE/BANDAGES/DRESSINGS) ×2 IMPLANT
SUT MNCRL 0 VIOLET CTX 36 (SUTURE) ×3 IMPLANT
SUT MONOCRYL 0 CTX 36 (SUTURE) ×6
SUT PLAIN 1 NONE 54 (SUTURE) IMPLANT
SUT PLAIN 2 0 XLH (SUTURE) ×3 IMPLANT
SUT VIC AB 0 CT1 27 (SUTURE) ×4
SUT VIC AB 0 CT1 27XBRD ANBCTR (SUTURE) ×2 IMPLANT
SUT VIC AB 2-0 CT1 27 (SUTURE) ×2
SUT VIC AB 2-0 CT1 TAPERPNT 27 (SUTURE) ×1 IMPLANT
SUT VIC AB 3-0 SH 27 (SUTURE) ×2
SUT VIC AB 3-0 SH 27X BRD (SUTURE) ×1 IMPLANT
SUT VIC AB 4-0 KS 27 (SUTURE) ×3 IMPLANT
SYR BULB IRRIGATION 50ML (SYRINGE) ×3 IMPLANT
TOWEL OR 17X24 6PK STRL BLUE (TOWEL DISPOSABLE) ×3 IMPLANT
TRAY FOLEY CATH SILVER 14FR (SET/KITS/TRAYS/PACK) ×3 IMPLANT

## 2016-02-18 NOTE — Op Note (Deleted)
  The note originally documented on this encounter has been moved the the encounter in which it belongs.  

## 2016-02-18 NOTE — Progress Notes (Signed)
MOB was referred for history of depression/anxiety. * Referral screened out by Clinical Social Worker because none of the following criteria appear to apply: ~ History of anxiety/depression during this pregnancy, or of post-partum depression. ~ Diagnosis of anxiety and/or depression within last 3 years OR * MOB's symptoms currently being treated with medication and/or therapy. Please contact the Clinical Social Worker if needs arise, or if MOB requests.  MOB has been taking Effexor prenatally.  Blaine HamperAngel Boyd-Gilyard, MSW, LCSW Clinical Social Work [phone]

## 2016-02-18 NOTE — Anesthesia Postprocedure Evaluation (Signed)
Anesthesia Post Note  Patient: Stacy Richards  Procedure(s) Performed: Procedure(s) (LRB): CESAREAN SECTION (N/A)  Patient location during evaluation: PACU Anesthesia Type: Spinal and MAC Level of consciousness: awake and alert Pain management: pain level controlled Vital Signs Assessment: post-procedure vital signs reviewed and stable Respiratory status: spontaneous breathing and respiratory function stable Cardiovascular status: blood pressure returned to baseline and stable Postop Assessment: spinal receding Anesthetic complications: no     Last Vitals:  Vitals:   02/18/16 1305 02/18/16 1323  BP:  138/89  Pulse: (!) 103 95  Resp: 20 18  Temp:  36.4 C    Last Pain:  Vitals:   02/18/16 1345  TempSrc:   PainSc: 2    Pain Goal: Patients Stated Pain Goal: 0 (02/18/16 1103)               Lewie LoronJohn Tiffany Talarico

## 2016-02-18 NOTE — Op Note (Signed)
NAMEKIMBERLYE, DILGER NO.:  [id number]  MEDICAL RECORD NO.:  [id number]  LOCATION:  ZO10                          FACILITY:  [facility]  PHYSICIAN:  Sherron Monday, MD        DATE OF BIRTH:  03-14-1981  DATE OF PROCEDURE:  02/18/2016 DATE OF DISCHARGE:  02/20/2016                              OPERATIVE REPORT   PREOPERATIVE DIAGNOSIS:  Intrauterine pregnancy at 38+ weeks.  History of low-transverse cesarean section, declined VBAC, history of pregnancy- induced hypertension, with chronic hypertension and worsening blood pressures for repeat low-transverse cesarean section.  POSTOPERATIVE DIAGNOSIS:  Intrauterine pregnancy at 38+ weeks.  History of low-transverse cesarean section, declined VBAC, history of pregnancy- induced hypertension, with chronic hypertension and worsening blood pressures for repeat low-transverse cesarean section, delivered.  PROCEDURE:  Repeat low-transverse cesarean section.  ASSISTANT:  Webb Silversmith, RNFA.  ANESTHESIA:  Spiral.  FINDINGS:  Viable female infant at 9:54 a.m. with Apgars of 9 at 1 minute and 9 at 5 minutes and the weight pending at the time of dictation.  Normal right tube and ovary.  Left adnexa with minimal adhesions with history of LSo\O, normal postpartum uterus with some uterine to peritoneal adhesions.  EBL:  Approximately 700 mL.  URINE OUTPUT:  150 mL, clear urine at the end of the procedure.  IV FLUIDS:  3100 mL.  COMPLICATIONS:  None.  PATHOLOGY:  Placenta to L and D.  DESCRIPTION OF PROCEDURE:  After informed consent was reviewed with the patient including risks, benefits, alternatives of surgical procedure, she was taken to the OR, where spinal anesthesia was placed and found to be adequate.  She was then returned to the supine position with a leftward tilt.  Prepped and draped in the normal sterile fashion.  A Foley catheter was sterilely placed.  A Pfannenstiel skin incision was made at the level of  her previous incision, carried through the underlying layer of fascia sharply.  Fascia was incised in the midline. The incision was extended laterally with Mayo scissors.  Superior aspect of the fascial incision was grasped with Kocher clamps, elevated, and rectus muscles were dissected off both bluntly and sharply.  Midline was easily identified and the peritoneum was entered bluntly.  The peritoneal incision was extended superiorly and inferiorly with good visualization of the bladder.  On entry into the peritoneum, fine adhesions were lysed manually.  The Alexis retractor was placed with some difficulty given the adhesions.  The uterus was incised in transverse fashion.  Infant was delivered from a vertex presentation. Nose and mouth were suctioned on the field.  After a minute, the cord was clamped and cut.  Infant was handed off to the waiting pediatric staff.  The placenta was expressed from the uterus.  Given to cord blood collection, the uterus was cleared of all clot and debris.  The uterine incision was closed with 0 Monocryl in 2 layers, the first of which was running locked and the second as an imbricating layer.  The gutters were cleared of all clot and debris.  The incision was made hemostatic at the left aspect.  There had been minimal bleeding due to the  layers not coming together smoothly.  The fascia was reapproximated with 0 Vicryl from either end overlapping in the midline.  The subcuticular adipose layer was made hemostatic with Bovie cautery.  The dead space was closed with 3-0 plain gut.  After the uterus was closed with 2 layers, the gutters were cleared of all clot and debris.  The peritoneum was reapproximated with 2-0 Vicryl in a running fashion.  Then, the fascia was closed with 2 sutures of 0 Vicryl overlapping in the midline.  The subcuticular adipose layer was made hemostatic with Bovie cautery.  The dead space was closed with 3-0 plain gut.  The skin was  closed with 4-0 Vicryl, repeat of the [facility].  Benzoin and Steri-Strips applied. The patient tolerated the procedure well.  Sponge, lap, and needle count were correct x2 per the operating staff.     Sherron MondayJody Bovard, MD     JB/MEDQ  D:  02/18/2016  T:  02/18/2016  Job:  [id number]

## 2016-02-18 NOTE — Transfer of Care (Signed)
Immediate Anesthesia Transfer of Care Note  Patient: Stacy Richards  Procedure(s) Performed: Procedure(s): CESAREAN SECTION (N/A)  Patient Location: PACU  Anesthesia Type:Spinal  Level of Consciousness: awake, alert  and oriented  Airway & Oxygen Therapy: Patient Spontanous Breathing  Post-op Assessment: Report given to RN  Post vital signs: Reviewed and stable  Last Vitals:  Vitals:   02/18/16 0827  BP: (!) 146/107  Pulse: (!) 105  Resp: 16  Temp: 36.8 C    Last Pain:  Vitals:   02/18/16 0827  TempSrc: Oral      Patients Stated Pain Goal: 3 (02/18/16 0827)  Complications: No apparent anesthesia complications

## 2016-02-18 NOTE — Interval H&P Note (Signed)
History and Physical Interval Note:  02/18/2016 9:01 AM  Stacy Richards  has presented today for surgery, with the diagnosis of Repeat C-Section  The various methods of treatment have been discussed with the patient and family. After consideration of risks, benefits and other options for treatment, the patient has consented to  Procedure(s): CESAREAN SECTION (N/A) as a surgical intervention .  The patient's history has been reviewed, patient examined, no change in status, stable for surgery.  I have reviewed the patient's chart and labs.  Questions were answered to the patient's satisfaction.     Bovard-Stuckert, Zoriah Pulice

## 2016-02-18 NOTE — Anesthesia Procedure Notes (Signed)
Spinal  Patient location during procedure: OR Staffing Anesthesiologist: Nolon Nations Performed: anesthesiologist  Preanesthetic Checklist Completed: patient identified, site marked, surgical consent, pre-op evaluation, timeout performed, IV checked, risks and benefits discussed and monitors and equipment checked Spinal Block Patient position: sitting Prep: Betadine Patient monitoring: heart rate, continuous pulse ox and blood pressure Location: L3-4 Injection technique: single-shot Needle Needle type: Sprotte  Needle gauge: 24 G Needle length: 9 cm Additional Notes Expiration date of kit checked and confirmed. Patient tolerated procedure well, without complications.

## 2016-02-18 NOTE — Progress Notes (Signed)
Patient ID: Stacy Richards, female   DOB: 09/24/1980, 35 y.o.   MRN: [id number] POD#0 Pt doing well post c/s No headache, fever or abnl bleeding Pain controlled with meds.  Bonding well with baby Routine pp/post op care

## 2016-02-18 NOTE — Brief Op Note (Signed)
02/18/2016  10:55 AM  PATIENT:  Stacy Richards  35 y.o. female  PRE-OPERATIVE DIAGNOSIS:  Repeat C-Section, history of LTCS, h/o PIH, CHTN  POST-OPERATIVE DIAGNOSIS:  Repeat C-Section, history of chronic hypertension, PIH, delivered  PROCEDURE:  Procedure(s): CESAREAN SECTION (N/A) repeat  SURGEON:  Surgeon(s) and Role:    * Sherian ReinJody Bovard-Stuckert, MD - Primary  ASSISTANTS: Webb SilversmithKrietemeyer, Heather, RNFA   ANESTHESIA:   spinal   FINDINGS: viable female infant at 9:54am Stacy Richards(Stacy Richards) apgars 9/9 at 1 and 5 minutes; wt P; nl Right tube and ovary.  Nl PP uterus - some adhesion (uterine to peritoneum)  EBL:  Total I/O In: 3100 [I.V.:3100] Out: 850 [Urine:150; Blood:700]  BLOOD ADMINISTERED:none  DRAINS: Urinary Catheter (Foley)   LOCAL MEDICATIONS USED:  NONE  SPECIMEN:  Source of Specimen:  Placenta  DISPOSITION OF SPECIMEN:  L&D  COUNTS:  YES  TOURNIQUET:  * No tourniquets in log *  DICTATION: .Other Dictation: Dictation Number [id number]  PLAN OF CARE: Admit to inpatient   PATIENT DISPOSITION:  PACU - hemodynamically stable.   Delay start of Pharmacological VTE agent (>24hrs) due to surgical blood loss or risk of bleeding: not applicable

## 2016-02-18 NOTE — Lactation Note (Signed)
This note was copied from a baby's chart. Lactation Consultation Note  Patient Name: Stacy Henreitta LeberJanae Nicosia ZOXWR'UToday's Date: 02/18/2016 Reason for consult: Initial assessment   Initial assessment at 1 hour of age in PACU. Mom is a repeat c/s. She was not able to latch her 33 yo twins who were born at 5736 w [address]. Mom wishes to BF this infant.   Infant was STS with mom when mom brought into PACU. She was rooting. Mom with large compressible breasts and areolar areas. Nipples are large and everted. Showed mom how to hand express, colostrum was visible and easy to express from both breasts. Enc mom to hand express prior to feeding and after feeding to apply EBM to nipples.   Latched infant to right breast in the laid back position. Infant latched eagerly with rhythmic suckles and intermittent swallows. Infant fed for 20 minutes and then was latched to left breast in laid back position. Mom reported a lot of pain with latch that did improve with repositioning and relatching. Infant fed for 10 minutes and then fell asleep. Infant oral structure examined due to nipple pain and was benign. She was noted to have good extension and elevation of tongue. Mom was pleased that infant fed well.   BF Basics, supply and demand, Cluster feeding, colostrum, milk coming to volume, positioning, and hand expression discussed. Enc mom to feed 8-12 x in 24 hours at first feeding cues. She will need to be shown football and cradle holds, they were discussed with mom. Advised mom to use good pillow support with feedings. Mom reports she has a Boppy Pillow with her.   BF Resources Handout and LC Brochure given, mom informed of IP/OP Services, BF Support Groups and LC phone #. Enc mom to call out for her RN if feeding assistance is needed. Mom reports she has a Spectra 2 pump at home. Follow up later today and prn.      Maternal Data Formula Feeding for Exclusion: No Has patient been taught Hand Expression?: Yes Does the patient have  breastfeeding experience prior to this delivery?: Yes  Feeding Feeding Type: Breast Fed Length of feed: 30 min  LATCH Score/Interventions Latch: Grasps breast easily, tongue down, lips flanged, rhythmical sucking.  Audible Swallowing: Spontaneous and intermittent  Type of Nipple: Everted at rest and after stimulation  Comfort (Breast/Nipple): Filling, red/small blisters or bruises, mild/mod discomfort  Problem noted: Mild/Moderate discomfort Interventions (Mild/moderate discomfort): Hand expression (Deepen latch)  Hold (Positioning): Full assist, staff holds infant at breast  LATCH Score: 7  Lactation Tools Discussed/Used WIC Program: No   Consult Status Consult Status: Follow-up Date: 02/18/16 Follow-up type: In-patient    Silas FloodSharon S Elayne Gruver 02/18/2016, 11:41 AM

## 2016-02-18 NOTE — Anesthesia Preprocedure Evaluation (Addendum)
Anesthesia Evaluation  Patient identified by MRN, date of birth, ID band Patient awake    Reviewed: Allergy & Precautions, NPO status , Patient's Chart, lab work & pertinent test results  Airway Mallampati: III  TM Distance: >3 FB Neck ROM: Full    Dental no notable dental hx.    Pulmonary neg pulmonary ROS,    Pulmonary exam normal breath sounds clear to auscultation       Cardiovascular hypertension, Normal cardiovascular exam Rhythm:Regular Rate:Normal     Neuro/Psych PSYCHIATRIC DISORDERS Anxiety Depression negative neurological ROS     GI/Hepatic Neg liver ROS, GERD  ,  Endo/Other  Morbid obesity  Renal/GU negative Renal ROS     Musculoskeletal negative musculoskeletal ROS (+)   Abdominal (+) + obese,   Peds  Hematology negative hematology ROS (+)   Anesthesia Other Findings   Reproductive/Obstetrics negative OB ROS                            Anesthesia Physical Anesthesia Plan  ASA: III  Anesthesia Plan: Spinal   Post-op Pain Management:    Induction:   Airway Management Planned:   Additional Equipment:   Intra-op Plan:   Post-operative Plan:   Informed Consent: I have reviewed the patients History and Physical, chart, labs and discussed the procedure including the risks, benefits and alternatives for the proposed anesthesia with the patient or authorized representative who has indicated his/her understanding and acceptance.   Dental advisory given  Plan Discussed with: CRNA  Anesthesia Plan Comments:       Anesthesia Quick Evaluation

## 2016-02-19 LAB — CBC
HCT: 28.9 % — ABNORMAL LOW (ref 36.0–46.0)
HEMOGLOBIN: 9.4 g/dL — AB (ref 12.0–15.0)
MCH: 26.3 pg (ref 26.0–34.0)
MCHC: 32.5 g/dL (ref 30.0–36.0)
MCV: 80.7 fL (ref 78.0–100.0)
PLATELETS: 376 10*3/uL (ref 150–400)
RBC: 3.58 MIL/uL — ABNORMAL LOW (ref 3.87–5.11)
RDW: 14.1 % (ref 11.5–15.5)
WBC: 13.8 10*3/uL — AB (ref 4.0–10.5)

## 2016-02-19 LAB — BIRTH TISSUE RECOVERY COLLECTION (PLACENTA DONATION)

## 2016-02-19 NOTE — Addendum Note (Signed)
Addendum  created 02/19/16 0739 by Earmon PhoenixValerie P Tyja Gortney, CRNA   Sign clinical note

## 2016-02-19 NOTE — Progress Notes (Signed)
Subjective: Postpartum Day 1: Cesarean Delivery Patient reports nausea, tolerating PO and + BM.    Objective: Vital signs in last 24 hours: Temp:  [97.4 F (36.3 C)-98.4 F (36.9 C)] 98.1 F (36.7 C) (10/05 0500) Pulse Rate:  [69-103] 100 (10/05 0500) Resp:  [13-27] 18 (10/05 0500) BP: (123-150)/(68-100) 143/88 (10/05 0500) SpO2:  [81 %-100 %] 95 % (10/04 1730)  Physical Exam:  General: alert and cooperative Lochia: appropriate Uterine Fundus: firm Incision: C/D/I    Recent Labs  02/17/16 1010 02/19/16 0526  HGB 10.8* 9.4*  HCT 33.1* 28.9*    Assessment/Plan: Status post Cesarean section. Doing well postoperatively.  Continue current care.  Oliver PilaRICHARDSON,Preslyn Warr W 02/19/2016, 8:44 AM

## 2016-02-19 NOTE — Anesthesia Postprocedure Evaluation (Signed)
Anesthesia Post Note  Patient: Stacy Richards  Procedure(s) Performed: Procedure(s) (LRB): CESAREAN SECTION (N/A)  Patient location during evaluation: Mother Baby Anesthesia Type: Spinal Level of consciousness: awake Pain management: pain level controlled Vital Signs Assessment: post-procedure vital signs reviewed and stable Respiratory status: spontaneous breathing Cardiovascular status: stable Postop Assessment: no headache, no backache, spinal receding and patient able to bend at knees Anesthetic complications: no     Last Vitals:  Vitals:   02/19/16 0130 02/19/16 0500  BP: 133/82 (!) 143/88  Pulse: 82 100  Resp: 18 18  Temp: 36.8 C 36.7 C    Last Pain:  Vitals:   02/19/16 0500  TempSrc: Oral  PainSc: 0-No pain   Pain Goal: Patients Stated Pain Goal: 0 (02/18/16 1103)               Edison PaceWILKERSON,Weylin Plagge

## 2016-02-20 MED ORDER — OXYCODONE HCL 5 MG PO TABS: 5.0000 mg | ORAL_TABLET | Freq: Four times a day (QID) | ORAL | 0 refills | Status: DC | PRN

## 2016-02-20 MED ORDER — IBUPROFEN 800 MG PO TABS: 800.0000 mg | ORAL_TABLET | Freq: Three times a day (TID) | ORAL | 1 refills | Status: DC | PRN

## 2016-02-20 MED ORDER — PRENATAL MULTIVITAMIN CH: 1.0000 | ORAL_TABLET | Freq: Every day | ORAL | 3 refills | Status: DC

## 2016-02-20 NOTE — Discharge Summary (Signed)
OB Discharge Summary     Patient Name: Stacy Richards DOB: 1981/01/23 MRN: [id number]  Date of admission: 02/18/2016 Delivering MD: Sherian Rein   Date of discharge: 02/20/2016  Admitting diagnosis: Repeat C-Section Intrauterine pregnancy: [redacted]w[redacted]d     Secondary diagnosis:  Principal Problem:   S/P cesarean section Active Problems:   HTN in pregnancy, chronic   History of cesarean section complicating pregnancy  Additional problems: none      Discharge diagnosis: Term Pregnancy Delivered                                                                                                Post partum procedures:none  Augmentation: N/A  Complications: None  Hospital course:  Sceduled C/S   35 y.o. yo G2P1103 at [redacted]w[redacted]d was admitted to the hospital 02/18/2016 for scheduled cesarean section with the following indication:Elective Repeat.  Membrane Rupture Time/Date: 9:53 AM ,02/18/2016   Patient delivered a Viable infant.02/18/2016  Details of operation can be found in separate operative note.  Pateint had an uncomplicated postpartum course.  She is ambulating, tolerating a regular diet, passing flatus, and urinating well. Patient is discharged home in stable condition on  02/20/16          Physical exam Vitals:   02/19/16 0500 02/19/16 0920 02/19/16 1831 02/20/16 0624  BP: (!) 143/88 139/85 123/74 (!) 142/101  Pulse: 100 (!) 108 98 82  Resp: 18 18 18 16   Temp: 98.1 F (36.7 C) 98 F (36.7 C) 97.8 F (36.6 C) 97.9 F (36.6 C)  TempSrc: Oral Oral Oral Oral  SpO2:  99%     General: alert and no distress Lochia: appropriate Uterine Fundus: firm Incision: Healing well with no significant drainage DVT Evaluation: No evidence of DVT seen on physical exam. Labs: Lab Results  Component Value Date   WBC 13.8 (H) 02/19/2016   HGB 9.4 (L) 02/19/2016   HCT 28.9 (L) 02/19/2016   MCV 80.7 02/19/2016   PLT 376 02/19/2016   CMP Latest Ref Rng & Units 02/17/2016  Glucose 65 - 99  mg/dL 79  BUN 6 - 20 mg/dL 7  Creatinine 7.82 - 9.56 mg/dL 2.13  Sodium 086 - 578 mmol/L 136  Potassium 3.5 - 5.1 mmol/L 4.5  Chloride 101 - 111 mmol/L 104  CO2 22 - 32 mmol/L 24  Calcium 8.9 - 10.3 mg/dL 9.3  Total Protein 6.0 - 8.3 g/dL -  Total Bilirubin 0.3 - 1.2 mg/dL -  Alkaline Phos 39 - 469 U/L -  AST 0 - 37 U/L -  ALT 0 - 35 U/L -    Discharge instruction: per After Visit Summary and "Baby and Me Booklet".  After visit meds:    Medication List    STOP taking these medications   aspirin 81 MG tablet   diphenhydramine-acetaminophen 25-500 MG Tabs tablet Commonly known as:  TYLENOL PM     TAKE these medications   esomeprazole 40 MG capsule Commonly known as:  NEXIUM Take 40 mg by mouth daily.   ibuprofen 800 MG tablet Commonly known as:  ADVIL,MOTRIN Take 1  tablet (800 mg total) by mouth every 8 (eight) hours as needed.   methyldopa 250 MG tablet Commonly known as:  ALDOMET Take 500 mg by mouth 2 (two) times daily.   oxyCODONE 5 MG immediate release tablet Commonly known as:  Oxy IR/ROXICODONE Take 1 tablet (5 mg total) by mouth every 6 (six) hours as needed (pain scale 4-7). Take 1-2 tablets po q 6 hours prn severe pain   prenatal multivitamin Tabs tablet Take 1 tablet by mouth daily at 12 noon.   venlafaxine XR 75 MG 24 hr capsule Commonly known as:  EFFEXOR-XR Take 75 mg by mouth 2 (two) times daily.       Diet: routine diet  Activity: Advance as tolerated. Pelvic rest for 6 weeks.   Outpatient follow up:2 and 6 weeks Follow up Appt:No future appointments. Follow up Visit:No Follow-up on file.  Postpartum contraception: IUD Mirena  Newborn Data: Live born female  Birth Weight: 7 lb 14.6 oz (3590 g) APGAR: 9, 9  Baby Feeding: Breast Disposition:home with mother   02/20/2016 Sherian ReinBovard-Stuckert, Donia Yokum, MD

## 2016-02-20 NOTE — Progress Notes (Addendum)
Subjective: Postpartum Day 2: Cesarean Delivery Patient reports incisional pain, tolerating PO and no problems voiding.  Pain controlled, nl lochia  Objective: Vital signs in last 24 hours: Temp:  [97.8 F (36.6 C)-98 F (36.7 C)] 97.9 F (36.6 C) (10/06 0624) Pulse Rate:  [82-108] 82 (10/06 0624) Resp:  [16-18] 16 (10/06 0624) BP: (123-142)/(74-101) 142/101 (10/06 0624) SpO2:  [99 %] 99 % (10/05 0920)  Physical Exam:  General: alert and no distress Lochia: appropriate Uterine Fundus: firm Incision: healing well DVT Evaluation: No evidence of DVT seen on physical exam.   Recent Labs  02/17/16 1010 02/19/16 0526  HGB 10.8* 9.4*  HCT 33.1* 28.9*    Assessment/Plan: Status post Cesarean section. Doing well postoperatively.  Continue current care.  Discharge home with motrin and Percocet and PNV,  F/u 2 weeks and 6 weeks  Richards, Stacy Sarin 02/20/2016, 8:24 AM

## 2016-02-20 NOTE — Lactation Note (Signed)
This note was copied from a baby's chart. Lactation Consultation Note: Mom reports baby has been nursing well- only a little pain with initial latch that then eases off after a few seconds. Reports baby last fed about 45 min ago for 10 min. Too sleepy at present- would not latch. Tried to breast fed her twins that are now 33 years old but they were premature and milk never came in. LS 9 by RN. Reports she had a lot of help from her RN Johnny BridgeMartha yesterday. Has Medela pump for home. Reviewed engorgement prevention and treatment. No questions at present. Reviewed our phone number, OP appointments and BFSG as resources for support after DC.   Patient Name: Stacy Richards ZOXWR'UToday's Date: 02/20/2016 Reason for consult: Follow-up assessment   Maternal Data Formula Feeding for Exclusion: No Has patient been taught Hand Expression?: Yes Does the patient have breastfeeding experience prior to this delivery?: Yes  Feeding Feeding Type: Breast Fed  LATCH Score/Interventions Latch: Too sleepy or reluctant, no latch achieved, no sucking elicited.  Audible Swallowing: None  Type of Nipple: Flat  Comfort (Breast/Nipple): Soft / non-tender     Hold (Positioning): No assistance needed to correctly position infant at breast.  LATCH Score: 5  Lactation Tools Discussed/Used WIC Program: No   Consult Status Consult Status: Complete    Pamelia HoitWeeks, Makeba Delcastillo D 02/20/2016, 11:00 AM

## 2016-02-21 LAB — TYPE AND SCREEN
ABO/RH(D): A POS
ANTIBODY SCREEN: NEGATIVE
UNIT DIVISION: 0
Unit division: 0

## 2016-03-17 ENCOUNTER — Telehealth (HOSPITAL_COMMUNITY): Payer: Self-pay | Admitting: Lactation Services

## 2016-03-17 NOTE — Telephone Encounter (Signed)
Returned mom's call in regards to wanting to rent a pump. Gave information on Monthly pump rentals and pump kit costs. Mom reports she may purchase another single Spectra to use vs DEBP rental. Discussed that if she is having milk supply issues a Hospital Grade DEBP may work better for her.   Mom reports that infant has in the last few days not seemed satisfied at the breast. She reports infant is feeding every 2 hours during the day and every 3-4 at night. She reports infant is voiding and stooling more than every feeding. She reports infants diaper is fitting more snug than it has been.She reports she was told by Ped to begin taking Fenugreek and to pump post BF. She reports she is taking 3 capsules of Fenugreek 3 times a day and is pumping for 10 minutes post BF with a single Spectra pump. She reports that she is getting a very small amount post pumping and has collected an ounce today. Advised mom that if there is concern with milk supply, all EBM should be offered to infant post BF. Mom reports her breast do feel fuller today and that she is getting a little bit more with pumping. Discussed normalcy of growth spurts in babies and a lot of what she is describing sounds like a normal growth spurt.   She asked if she needed to pump at night, advised she could pump post every BF or power pump at night before bed and pump post BF during day time feeds. Enc her to increase pumping times to 15-20 minutes. Shared information in Mother Love More Milk plus to use in place of Fenugreek if mom does not see an increase in volume in the next day or so. Enc mom to relax with pumping and to not watch the pump  Infant is to have a weight check next week at [facility] office. Enc mom to call back with further questions/concerns. Mom reports she is glad she called and feels better about what is going on.

## 2017-02-22 DIAGNOSIS — E785 Hyperlipidemia, unspecified: Secondary | ICD-10-CM | POA: Insufficient documentation

## 2017-08-30 ENCOUNTER — Ambulatory Visit: Payer: Self-pay | Admitting: Dietician

## 2017-09-09 NOTE — Telephone Encounter (Signed)
To close encounter

## 2017-10-03 ENCOUNTER — Encounter: Payer: Self-pay | Admitting: Dietician

## 2017-10-03 NOTE — Progress Notes (Signed)
Have not heard back from patient to reschedule her missed appointment from 08/30/17. Sent letter to referring provider.

## 2018-11-27 DIAGNOSIS — E669 Obesity, unspecified: Secondary | ICD-10-CM | POA: Insufficient documentation

## 2019-08-02 ENCOUNTER — Ambulatory Visit: Payer: Medicaid Other | Attending: Internal Medicine

## 2019-08-02 DIAGNOSIS — Z23 Encounter for immunization: Secondary | ICD-10-CM

## 2019-08-02 NOTE — Progress Notes (Signed)
   Covid-19 Vaccination Clinic  Name:  LANYA BUCKS    MRN: [id number] DOB: April 18, 1981  08/02/2019  Ms. Caudell was observed post Covid-19 immunization for 15 minutes without incident. She was provided with Vaccine Information Sheet and instruction to access the V-Safe system.   Ms. Saine was instructed to call 911 with any severe reactions post vaccine: Marland Kitchen Difficulty breathing  . Swelling of face and throat  . A fast heartbeat  . A bad rash all over body  . Dizziness and weakness   Immunizations Administered    Name Date Dose VIS Date Route   Pfizer COVID-19 Vaccine 08/02/2019 10:47 AM 0.3 mL 04/27/2019 Intramuscular   Manufacturer: [employer], [employer]   Lot: [id number]   NDC: [address]-8

## 2019-08-27 ENCOUNTER — Ambulatory Visit: Payer: Medicaid Other | Attending: Internal Medicine

## 2019-08-27 DIAGNOSIS — Z23 Encounter for immunization: Secondary | ICD-10-CM

## 2019-08-27 NOTE — Progress Notes (Signed)
   Covid-19 Vaccination Clinic  Name:  Stacy Richards    MRN: [id number] DOB: 05/05/1981  08/27/2019  Stacy Richards was observed post Covid-19 immunization for 15 minutes without incident. She was provided with Vaccine Information Sheet and instruction to access the V-Safe system.   Stacy Richards was instructed to call 911 with any severe reactions post vaccine: Marland Kitchen Difficulty breathing  . Swelling of face and throat  . A fast heartbeat  . A bad rash all over body  . Dizziness and weakness   Immunizations Administered    Name Date Dose VIS Date Route   Pfizer COVID-19 Vaccine 08/27/2019  1:40 PM 0.3 mL 04/27/2019 Intramuscular   Manufacturer: [employer], [employer]   Lot: [id number]   NDC: [address]-1

## 2019-09-01 ENCOUNTER — Emergency Department
Admission: EM | Admit: 2019-09-01 | Discharge: 2019-09-01 | Disposition: A | Discharge status: Home / Self Care | Payer: BLUE CROSS/BLUE SHIELD | Attending: Emergency Medicine | Admitting: Emergency Medicine

## 2019-09-01 ENCOUNTER — Other Ambulatory Visit: Payer: Self-pay

## 2019-09-01 ENCOUNTER — Encounter: Payer: Self-pay | Admitting: Emergency Medicine

## 2019-09-01 DIAGNOSIS — Z79899 Other long term (current) drug therapy: Secondary | ICD-10-CM | POA: Insufficient documentation

## 2019-09-01 DIAGNOSIS — I10 Essential (primary) hypertension: Secondary | ICD-10-CM | POA: Diagnosis not present

## 2019-09-01 DIAGNOSIS — N390 Urinary tract infection, site not specified: Secondary | ICD-10-CM | POA: Insufficient documentation

## 2019-09-01 DIAGNOSIS — R1031 Right lower quadrant pain: Secondary | ICD-10-CM | POA: Diagnosis present

## 2019-09-01 LAB — COMPREHENSIVE METABOLIC PANEL
ALT: 21 U/L (ref 0–44)
AST: 15 U/L (ref 15–41)
Albumin: 4 g/dL (ref 3.5–5.0)
Alkaline Phosphatase: 92 U/L (ref 38–126)
Anion gap: 9 (ref 5–15)
BUN: 14 mg/dL (ref 6–20)
CO2: 26 mmol/L (ref 22–32)
Calcium: 8.7 mg/dL — ABNORMAL LOW (ref 8.9–10.3)
Chloride: 100 mmol/L (ref 98–111)
Creatinine, Ser: 0.8 mg/dL (ref 0.44–1.00)
GFR calc Af Amer: >60 mL/min (ref >60)
GFR calc non Af Amer: >60 mL/min (ref >60)
Glucose, Bld: 98 mg/dL (ref 70–99)
Potassium: 4.2 mmol/L (ref 3.5–5.1)
Sodium: 135 mmol/L (ref 135–145)
Total Bilirubin: 0.7 mg/dL (ref 0.3–1.2)
Total Protein: 7.6 g/dL (ref 6.5–8.1)

## 2019-09-01 LAB — URINALYSIS, COMPLETE (UACMP) WITH MICROSCOPIC
Bilirubin Urine: NEGATIVE
Glucose, UA: NEGATIVE mg/dL
Ketones, ur: NEGATIVE mg/dL
Nitrite: NEGATIVE
Protein, ur: NEGATIVE mg/dL
Specific Gravity, Urine: 1.015 (ref 1.005–1.030)
pH: 7 (ref 5.0–8.0)

## 2019-09-01 LAB — LIPASE, BLOOD: Lipase: 30 U/L (ref 11–51)

## 2019-09-01 LAB — CBC
HCT: 40.1 % (ref 36.0–46.0)
Hemoglobin: 13 g/dL (ref 12.0–15.0)
MCH: 27.3 pg (ref 26.0–34.0)
MCHC: 32.4 g/dL (ref 30.0–36.0)
MCV: 84.2 fL (ref 80.0–100.0)
Platelets: 359 10*3/uL (ref 150–400)
RBC: 4.76 MIL/uL (ref 3.87–5.11)
RDW: 14.4 % (ref 11.5–15.5)
WBC: 8.4 10*3/uL (ref 4.0–10.5)
nRBC: 0 % (ref 0.0–0.2)

## 2019-09-01 LAB — POCT PREGNANCY, URINE: Preg Test, Ur: NEGATIVE

## 2019-09-01 MED ORDER — CEPHALEXIN 500 MG PO CAPS: 500.0000 mg | ORAL_CAPSULE | Freq: Two times a day (BID) | ORAL | 0 refills | Status: DC

## 2019-09-01 MED ORDER — ONDANSETRON HCL 4 MG/2ML IJ SOLN
4.0000 mg | Freq: Once | INTRAMUSCULAR | Status: AC
  Administered 2019-09-01: 09:00:00 4 mg via INTRAVENOUS
  Filled 2019-09-01: qty 2

## 2019-09-01 MED ORDER — KETOROLAC TROMETHAMINE 30 MG/ML IJ SOLN
30.0000 mg | Freq: Once | INTRAMUSCULAR | Status: AC
  Administered 2019-09-01: 09:00:00 30 mg via INTRAVENOUS
  Filled 2019-09-01: qty 1

## 2019-09-01 NOTE — ED Provider Notes (Signed)
[facility] Emergency Department Provider Note   ____________________________________________    I have reviewed the triage vital signs and the nursing notes.   HISTORY  Chief Complaint Abdominal Pain and Back Pain     HPI Stacy Richards is a 39 y.o. female with a history as noted below who presents with complaint of right lower quadrant abdominal pain.  Patient reports a dull aching sensation for nearly a week which worsened significantly today.  She describes as a moderate pain.  Does not radiate except occasionally into her back.  No history of kidney stones.  No dysuria.  No hematuria.  Has not taken anything for this.  Mild nausea.  No vaginal bleeding.  Does have a Mirena IUD.  Past Medical History:  Diagnosis Date  . Anxiety   . Depression   . GERD (gastroesophageal reflux disease)   . History of cesarean section complicating pregnancy 15/08/84  . History of pre-eclampsia   . HTN in pregnancy, chronic 02/17/2016  . Hypertension   . S/P cesarean section 02/18/2016    Patient Active Problem List   Diagnosis Date Noted  . S/P cesarean section 02/18/2016  . HTN in pregnancy, chronic 02/17/2016  . History of cesarean section complicating pregnancy 76/19/5093    Past Surgical History:  Procedure Laterality Date  . CESAREAN SECTION    . CESAREAN SECTION N/A 02/18/2016   Procedure: CESAREAN SECTION;  Surgeon: Janyth Contes, MD;  Location: [facility];  Service: Obstetrics;  Laterality: N/A;    Prior to Admission medications   Medication Sig Start Date End Date Taking? Authorizing Provider  cephALEXin (KEFLEX) 500 MG capsule Take 1 capsule (500 mg total) by mouth 2 (two) times daily. 09/01/19   Lavonia Drafts, MD  esomeprazole (NEXIUM) 40 MG capsule Take 40 mg by mouth daily.  08/01/15   [provider]  ibuprofen (ADVIL,MOTRIN) 800 MG tablet Take 1 tablet (800 mg total) by mouth every 8 (eight) hours as needed. 02/20/16    Bovard-Stuckert, Jeral Fruit, MD  methyldopa (ALDOMET) 250 MG tablet Take 500 mg by mouth 2 (two) times daily.  12/17/15   [provider]  oxyCODONE (OXY IR/ROXICODONE) 5 MG immediate release tablet Take 1 tablet (5 mg total) by mouth every 6 (six) hours as needed (pain scale 4-7). Take 1-2 tablets po q 6 hours prn severe pain 02/20/16   Bovard-Stuckert, Jody, MD  Prenatal Vit-Fe Fumarate-FA (PRENATAL MULTIVITAMIN) TABS tablet Take 1 tablet by mouth daily at 12 noon. 02/20/16   Bovard-Stuckert, Jeral Fruit, MD  venlafaxine XR (EFFEXOR-XR) 75 MG 24 hr capsule Take 75 mg by mouth 2 (two) times daily.  01/20/16   [provider]     Allergies Patient has no known allergies.  Family History  Problem Relation Age of Onset  . Hypertension Mother   . Hypertension Father   . COPD Paternal Uncle        lung  . Hypertension Maternal Grandmother   . Skin cancer Maternal Grandmother   . Hypertension Maternal Grandfather   . Hypertension Paternal Grandmother   . Skin cancer Paternal Grandmother   . Hypertension Paternal Grandfather   . COPD Paternal Grandfather        lung    Social History Social History   Tobacco Use  . Smoking status: Never Smoker  . Smokeless tobacco: Never Used  Substance Use Topics  . Alcohol use: No  . Drug use: No    Review of Systems  Constitutional: No  fever/chills Eyes: No visual changes.  ENT: No sore throat. Cardiovascular: Denies chest pain. Respiratory: Denies shortness of breath. Gastrointestinal: As above Genitourinary: As above, no vaginal discharge Musculoskeletal: Negative for back pain. Skin: Negative for rash. Neurological: Negative for headache   ____________________________________________   PHYSICAL EXAM:  VITAL SIGNS: ED Triage Vitals  Enc Vitals Group     BP 09/01/19 0903 (!) 141/87     Pulse Rate 09/01/19 0903 91     Resp 09/01/19 0903 18     Temp 09/01/19 0903 98.3 F (36.8 C)     Temp Source 09/01/19 0903 Oral     SpO2  09/01/19 0903 99 %     Weight 09/01/19 0900 124.7 kg (275 lb)     Height 09/01/19 0900 1.651 m (5\' 5" )     Head Circumference --      Peak Flow --      Pain Score 09/01/19 0859 6     Pain Loc --      Pain Edu? --      Excl. in GC? --     Constitutional: Alert and oriented.   Nose: No congestion/rhinnorhea. Mouth/Throat: Mucous membranes are moist.    Cardiovascular: Normal rate, regular rhythm.   Good peripheral circulation. Respiratory: Normal respiratory effort.  No retractions. Gastrointestinal: Mild tenderness palpation the right lower quadrant, abdomen is soft. No distention.  No CVA tenderness. Genitourinary: deferred Musculoskeletal:   Warm and well perfused Neurologic:  Normal speech and language. No gross focal neurologic deficits are appreciated.  Skin:  Skin is warm, dry and intact. No rash noted. Psychiatric: Mood and affect are normal. Speech and behavior are normal.  ____________________________________________   LABS (all labs ordered are listed, but only abnormal results are displayed)  Labs Reviewed  COMPREHENSIVE METABOLIC PANEL - Abnormal; Notable for the following components:      Result Value   Calcium 8.7 (*)    All other components within normal limits  URINALYSIS, COMPLETE (UACMP) WITH MICROSCOPIC - Abnormal; Notable for the following components:   Color, Urine YELLOW (*)    APPearance HAZY (*)    Hgb urine dipstick SMALL (*)    Leukocytes,Ua SMALL (*)    Bacteria, UA MANY (*)    All other components within normal limits  CBC  LIPASE, BLOOD  POCT PREGNANCY, URINE   ____________________________________________  EKG  None ____________________________________________  RADIOLOGY   ____________________________________________   PROCEDURES  Procedure(s) performed: No  Procedures   Critical Care performed: No ____________________________________________   INITIAL IMPRESSION / ASSESSMENT AND PLAN / ED COURSE  Pertinent labs &  imaging results that were available during my care of the patient were reviewed by me and considered in my medical decision making (see chart for details).  Patient presents with right lower quadrant abdominal pain.  Mild tenderness to palpation the area.  Differential includes appendicitis, colitis, ovarian cyst, UTI/kidney stone.  Time course seems prolonged for appendicitis.  Will obtain labs, urinalysis, treat with IV Toradol, IV Zofran and reevaluate.  Patient feeling much better after IV Toradol and IV Zofran.  Lab work is very reassuring, normal CBC, normal CMP, normal lipase, not pregnant.  Pending urinalysis.  If unremarkable urinalysis anticipate discharge with outpatient follow-up, no indication for emergent imaging.    ____________________________________________   FINAL CLINICAL IMPRESSION(S) / ED DIAGNOSES  Final diagnoses:  Right lower quadrant abdominal pain  Lower urinary tract infectious disease        Note:  This document was prepared using Dragon  voice recognition software and may include unintentional dictation errors.   Jene Every, MD 09/01/19 1110

## 2019-09-01 NOTE — ED Triage Notes (Signed)
Pt arrived via POV with c/o lower abd pain and right side back pain, states the abd pain started last Sunday but worse today, pt states she woke up in a ball on the floor,  Pt c/o feeling nauseated.

## 2019-09-04 ENCOUNTER — Encounter: Payer: Self-pay | Admitting: Emergency Medicine

## 2019-09-04 ENCOUNTER — Emergency Department
Admission: EM | Admit: 2019-09-04 | Discharge: 2019-09-05 | Disposition: A | Discharge status: Home / Self Care | Payer: BLUE CROSS/BLUE SHIELD | Attending: Emergency Medicine | Admitting: Emergency Medicine

## 2019-09-04 ENCOUNTER — Emergency Department: Payer: BLUE CROSS/BLUE SHIELD

## 2019-09-04 ENCOUNTER — Other Ambulatory Visit: Payer: Self-pay

## 2019-09-04 DIAGNOSIS — N201 Calculus of ureter: Secondary | ICD-10-CM

## 2019-09-04 DIAGNOSIS — R1031 Right lower quadrant pain: Secondary | ICD-10-CM | POA: Insufficient documentation

## 2019-09-04 DIAGNOSIS — N1330 Unspecified hydronephrosis: Secondary | ICD-10-CM

## 2019-09-04 DIAGNOSIS — T8332XA Displacement of intrauterine contraceptive device, initial encounter: Secondary | ICD-10-CM

## 2019-09-04 DIAGNOSIS — N83201 Unspecified ovarian cyst, right side: Secondary | ICD-10-CM

## 2019-09-04 LAB — COMPREHENSIVE METABOLIC PANEL
ALT: 21 U/L (ref 0–44)
AST: 17 U/L (ref 15–41)
Albumin: 4.1 g/dL (ref 3.5–5.0)
Alkaline Phosphatase: 94 U/L (ref 38–126)
Anion gap: 6 (ref 5–15)
BUN: 14 mg/dL (ref 6–20)
CO2: 27 mmol/L (ref 22–32)
Calcium: 9.1 mg/dL (ref 8.9–10.3)
Chloride: 103 mmol/L (ref 98–111)
Creatinine, Ser: 1.08 mg/dL — ABNORMAL HIGH (ref 0.44–1.00)
GFR calc Af Amer: >60 mL/min (ref >60)
GFR calc non Af Amer: >60 mL/min (ref >60)
Glucose, Bld: 111 mg/dL — ABNORMAL HIGH (ref 70–99)
Potassium: 4.9 mmol/L (ref 3.5–5.1)
Sodium: 136 mmol/L (ref 135–145)
Total Bilirubin: 0.6 mg/dL (ref 0.3–1.2)
Total Protein: 7.5 g/dL (ref 6.5–8.1)

## 2019-09-04 LAB — URINALYSIS, COMPLETE (UACMP) WITH MICROSCOPIC
Bilirubin Urine: NEGATIVE
Glucose, UA: NEGATIVE mg/dL
Ketones, ur: NEGATIVE mg/dL
Nitrite: NEGATIVE
Protein, ur: 100 mg/dL — AB
RBC / HPF: >50 RBC/hpf — ABNORMAL HIGH (ref 0–5)
Specific Gravity, Urine: 1.024 (ref 1.005–1.030)
pH: 5 (ref 5.0–8.0)

## 2019-09-04 LAB — CBC WITH DIFFERENTIAL/PLATELET
Abs Immature Granulocytes: 0.04 10*3/uL (ref 0.00–0.07)
Basophils Absolute: 0 10*3/uL (ref 0.0–0.1)
Basophils Relative: 0 %
Eosinophils Absolute: 0.2 10*3/uL (ref 0.0–0.5)
Eosinophils Relative: 1 %
HCT: 38.4 % (ref 36.0–46.0)
Hemoglobin: 12.5 g/dL (ref 12.0–15.0)
Immature Granulocytes: 0 %
Lymphocytes Relative: 20 %
Lymphs Abs: 2.4 10*3/uL (ref 0.7–4.0)
MCH: 27.1 pg (ref 26.0–34.0)
MCHC: 32.6 g/dL (ref 30.0–36.0)
MCV: 83.3 fL (ref 80.0–100.0)
Monocytes Absolute: 0.7 10*3/uL (ref 0.1–1.0)
Monocytes Relative: 6 %
Neutro Abs: 8.7 10*3/uL — ABNORMAL HIGH (ref 1.7–7.7)
Neutrophils Relative %: 73 %
Platelets: 352 10*3/uL (ref 150–400)
RBC: 4.61 MIL/uL (ref 3.87–5.11)
RDW: 14.4 % (ref 11.5–15.5)
WBC: 12 10*3/uL — ABNORMAL HIGH (ref 4.0–10.5)
nRBC: 0 % (ref 0.0–0.2)

## 2019-09-04 LAB — POCT PREGNANCY, URINE: Preg Test, Ur: NEGATIVE

## 2019-09-04 MED ORDER — ONDANSETRON HCL 4 MG/2ML IJ SOLN
4.0000 mg | Freq: Once | INTRAMUSCULAR | Status: AC
  Administered 2019-09-04: 23:00:00 4 mg via INTRAVENOUS
  Filled 2019-09-04: qty 2

## 2019-09-04 MED ORDER — SODIUM CHLORIDE 0.9 % IV BOLUS
1000.0000 mL | Freq: Once | INTRAVENOUS | Status: AC
  Administered 2019-09-04: 1000 mL via INTRAVENOUS

## 2019-09-04 MED ORDER — MORPHINE SULFATE (PF) 4 MG/ML IV SOLN
4.0000 mg | Freq: Once | INTRAVENOUS | Status: AC
  Administered 2019-09-04: 23:00:00 4 mg via INTRAVENOUS
  Filled 2019-09-04: qty 1

## 2019-09-04 MED ORDER — IOHEXOL 300 MG/ML  SOLN
100.0000 mL | Freq: Once | INTRAMUSCULAR | Status: AC | PRN
  Administered 2019-09-04: 23:00:00 100 mL via INTRAVENOUS
  Filled 2019-09-04: qty 100

## 2019-09-04 NOTE — ED Provider Notes (Signed)
----------------------------------------- 10:15 PM on 09/04/2019 -----------------------------------------  Blood pressure 140/74, pulse 94, temperature 98 F (36.7 C), temperature source Oral, resp. rate 20, height 5\' 5"  (1.651 m), weight 124.7 kg, SpO2 99 %.  Assuming care from Dr. Cherylann Banas.  In short, Stacy Richards is a 39 y.o. female with a chief complaint of Abdominal Pain .  Refer to the original H&P for additional details.  The current plan of care is to await CT scan.  Patient presented to the emergency department with 3 days of ongoing right lower abdominal pain.  Patient been diagnosed with a UTI initially.  She been treated with Keflex but did not improve.  Patient returns with ongoing right lower quadrant pain.  At this time patient is awaiting results of imaging to evaluate for ureteral stone, appendicitis, colitis.Marland Kitchen   ----------------------------------------- 12:07 AM on 09/05/2019 -----------------------------------------  CT returned with results at this time.  Results are below:  CLINICAL DATA: Right flank and right lower quadrant pain. Nausea.  EXAM: CT ABDOMEN AND PELVIS WITH CONTRAST  TECHNIQUE: Multidetector CT imaging of the abdomen and pelvis was performed using the standard protocol following bolus administration of intravenous contrast.  CONTRAST: 197mL OMNIPAQUE IOHEXOL 300 MG/ML SOLN  COMPARISON: None.  FINDINGS: Lower chest: Tiny subpleural nodule in the right middle lobe, likely post infectious or inflammatory in a patient of this age. No focal airspace disease or pleural fluid. The heart is normal in size.  Hepatobiliary: Mild decreased hepatic density consistent with steatosis. No focal liver lesion. Gallbladder physiologically distended, no calcified stone. No biliary dilatation.  Pancreas: No ductal dilatation or inflammation.  Spleen: Normal in size without focal abnormality.  Adrenals/Urinary Tract: Normal adrenal glands.  Obstructing  4 x 6 mm stone in the distal right ureter just proximal to the ureterovesicular junction with moderate hydronephrosis. Mild right perinephric edema. No additional renal calculi. Unremarkable appearance of the left kidney without hydronephrosis. The left ureter is decompressed. Urinary bladder is partially distended. No bladder stone.  Stomach/Bowel: The stomach is unremarkable. Normal positioning of the ligament of Treitz. There is no small bowel obstruction or inflammation. Cecum is high-riding in the right mid abdomen. Terminal ileum is normal. Appendix is not definitively visualized, no evidence of appendicitis. Small to moderate volume of stool in the colon.  Vascular/Lymphatic: Abdominal aorta is normal in caliber. The portal vein is patent. No enlarged lymph nodes in the abdomen or pelvis.  Reproductive: Intrauterine device is abnormally positioned in the uterus. The IUD is rotated with the short arms directed AP. The stem is located in the lower uterine segment/cervix. One of the short arms extends into the myometrium and beyond the uterine contour, series 2, image 73. Septated cyst versus 2 adjacent cysts in the right ovary measuring 4.1 x 3.7 x 3.9 cm. No adjacent inflammatory change. Left ovary is not confidently visualized.  Other: No free air, free fluid, or intra-abdominal fluid collection.  Musculoskeletal: There are no acute or suspicious osseous abnormalities. Mild degenerative change in the lower lumbar spine with vacuum phenomenon.  IMPRESSION: 1. Obstructing 4 x 6 mm stone in the distal right ureter just proximal to the ureterovesicular junction with moderate right hydronephrosis. 2. Abnormally positioned IUD in the uterus. The IUD is rotated with the short arms directed AP. One of the short arms extends into the myometrium and beyond the uterine contour. Recommend outpatient gyn consultation.. 3. Septated cyst versus 2 adjacent cysts in the right  ovary measuring 4.1 x 3.7 x 3.9 cm. No adjacent  inflammatory change. This is likely physiologic in a patient of this age. 4. Mild hepatic steatosis.   Given patient's right lower quadrant pain, ovarian cyst measuring 3 and 4 cm even with other findings patient will be evaluated with ultrasound to ensure no torsion.  At this time, the section of the emergency department has closed and patient will be transferred to the major side emergency department awaiting final results.  I discussed the patient with attending provider, Dr. York Cerise.  York Cerise is aware of results at this time, plan for the patient and that ultrasound is pending.  Final diagnosis and disposition will be provided by attending provider at that time.   Lanette Hampshire 09/05/19 Lazarus Gowda    Loleta Rose, MD 09/05/19 [phone]

## 2019-09-04 NOTE — ED Provider Notes (Signed)
[facility] Emergency Department Provider Note ____________________________________________   First MD Initiated Contact with Patient 09/04/19 2111     (approximate)  I have reviewed the triage vital signs and the nursing notes.   HISTORY  Chief Complaint Abdominal Pain    HPI Stacy Richards is a 39 y.o. female with PMH as noted below who presents with right lower quadrant and right flank pain for the last week, persistent course over the last several days, and nonradiating.  She reports nausea but no vomiting.  She denies any urinary symptoms, vaginal bleeding or discharge, or any fever or chills.  She was seen in the ED a few days ago and started on Keflex for presumed UTI but has not had any improvement.  Past Medical History:  Diagnosis Date  . Anxiety   . Depression   . GERD (gastroesophageal reflux disease)   . History of cesarean section complicating pregnancy 35/07/6142  . History of pre-eclampsia   . HTN in pregnancy, chronic 02/17/2016  . Hypertension   . S/P cesarean section 02/18/2016    Patient Active Problem List   Diagnosis Date Noted  . S/P cesarean section 02/18/2016  . HTN in pregnancy, chronic 02/17/2016  . History of cesarean section complicating pregnancy 31/54/0086    Past Surgical History:  Procedure Laterality Date  . CESAREAN SECTION    . CESAREAN SECTION N/A 02/18/2016   Procedure: CESAREAN SECTION;  Surgeon: Janyth Contes, MD;  Location: [facility];  Service: Obstetrics;  Laterality: N/A;    Prior to Admission medications   Medication Sig Start Date End Date Taking? Authorizing Provider  cephALEXin (KEFLEX) 500 MG capsule Take 1 capsule (500 mg total) by mouth 2 (two) times daily. 09/01/19   Lavonia Drafts, MD  esomeprazole (NEXIUM) 40 MG capsule Take 40 mg by mouth daily.  08/01/15   [provider]  ibuprofen (ADVIL,MOTRIN) 800 MG tablet Take 1 tablet (800 mg total) by mouth every 8 (eight) hours  as needed. 02/20/16   Bovard-Stuckert, Jeral Fruit, MD  methyldopa (ALDOMET) 250 MG tablet Take 500 mg by mouth 2 (two) times daily.  12/17/15   [provider]  oxyCODONE (OXY IR/ROXICODONE) 5 MG immediate release tablet Take 1 tablet (5 mg total) by mouth every 6 (six) hours as needed (pain scale 4-7). Take 1-2 tablets po q 6 hours prn severe pain 02/20/16   Bovard-Stuckert, Jody, MD  Prenatal Vit-Fe Fumarate-FA (PRENATAL MULTIVITAMIN) TABS tablet Take 1 tablet by mouth daily at 12 noon. 02/20/16   Bovard-Stuckert, Jeral Fruit, MD  venlafaxine XR (EFFEXOR-XR) 75 MG 24 hr capsule Take 75 mg by mouth 2 (two) times daily.  01/20/16   [provider]    Allergies Patient has no known allergies.  Family History  Problem Relation Age of Onset  . Hypertension Mother   . Hypertension Father   . COPD Paternal Uncle        lung  . Hypertension Maternal Grandmother   . Skin cancer Maternal Grandmother   . Hypertension Maternal Grandfather   . Hypertension Paternal Grandmother   . Skin cancer Paternal Grandmother   . Hypertension Paternal Grandfather   . COPD Paternal Grandfather        lung    Social History Social History   Tobacco Use  . Smoking status: Never Smoker  . Smokeless tobacco: Never Used  Substance Use Topics  . Alcohol use: No  . Drug use: No    Review of Systems  Constitutional: No  fever/chills. Eyes: No visual changes. ENT: No sore throat. Cardiovascular: Denies chest pain. Respiratory: Denies shortness of breath. Gastrointestinal: Positive for nausea. Genitourinary: Negative for dysuria.  Musculoskeletal: Negative for back pain. Skin: Negative for rash. Neurological: Negative for headache.   ____________________________________________   PHYSICAL EXAM:  VITAL SIGNS: ED Triage Vitals [09/04/19 1920]  Enc Vitals Group     BP 140/74     Pulse Rate 94     Resp 20     Temp 98 F (36.7 C)     Temp Source Oral     SpO2 99 %     Weight 275 lb (124.7 kg)       Height 5\' 5"  (1.651 m)     Head Circumference      Peak Flow      Pain Score 10     Pain Loc      Pain Edu?      Excl. in GC?     Constitutional: Alert and oriented. Well appearing and in no acute distress. Eyes: Conjunctivae are normal.  Head: Atraumatic. Nose: No congestion/rhinnorhea. Mouth/Throat: Mucous membranes are moist.   Neck: Normal range of motion.  Cardiovascular: Good peripheral circulation. Respiratory: Normal respiratory effort.  No retractions.  Gastrointestinal: Soft with mild right lower quadrant tenderness.  No distention.  Genitourinary: No CVA tenderness. Musculoskeletal: Extremities warm and well perfused.  Neurologic:  Normal speech and language. No gross focal neurologic deficits are appreciated.  Skin:  Skin is warm and dry. No rash noted. Psychiatric: Mood and affect are normal. Speech and behavior are normal.  ____________________________________________   LABS (all labs ordered are listed, but only abnormal results are displayed)  Labs Reviewed  CBC WITH DIFFERENTIAL/PLATELET - Abnormal; Notable for the following components:      Result Value   WBC 12.0 (*)    Neutro Abs 8.7 (*)    All other components within normal limits  COMPREHENSIVE METABOLIC PANEL - Abnormal; Notable for the following components:   Glucose, Bld 111 (*)    Creatinine, Ser 1.08 (*)    All other components within normal limits  URINALYSIS, COMPLETE (UACMP) WITH MICROSCOPIC - Abnormal; Notable for the following components:   Color, Urine YELLOW (*)    APPearance HAZY (*)    Hgb urine dipstick LARGE (*)    Protein, ur 100 (*)    Leukocytes,Ua TRACE (*)    RBC / HPF >50 (*)    Bacteria, UA RARE (*)    All other components within normal limits  POCT PREGNANCY, URINE   ____________________________________________  EKG   ____________________________________________  RADIOLOGY  CT abdomen:  Pending  ____________________________________________   PROCEDURES  Procedure(s) performed: No  Procedures  Critical Care performed: No ____________________________________________   INITIAL IMPRESSION / ASSESSMENT AND PLAN / ED COURSE  Pertinent labs & imaging results that were available during my care of the patient were reviewed by me and considered in my medical decision making (see chart for details).  39 year old female with PMH as noted above presents with persistent right lower quadrant pain over the last several days associated with nausea but no fever, urinary symptoms, vaginal bleeding, or discharge.  I reviewed the past medical records in Epic.  The patient was seen in the ED 3 days ago with the same pain.  At that time her lab work-up was reassuring and the urinalysis was suggestive of UTI.  She was treated empirically for UTI with Keflex and did not require any imaging.  On exam today,  the patient is overall well-appearing and her vital signs are normal.  The abdomen is soft but she does have some mild right lower quadrant tenderness.  Repeat labs today now show mild leukocytosis.  The patient's urinalysis shows more significant predominance of RBCs.  Differential includes persistent UTI/pyelonephritis that is not responding to antibiotics, ureteral stone, appendicitis, colitis, or other GI etiology.  We will obtain a CT for further evaluation.  ----------------------------------------- 10:02 PM on 09/04/2019 -----------------------------------------  Patient is pending CT.  I signed her out to PA Cuthriell and MD Jessup. ____________________________________________   FINAL CLINICAL IMPRESSION(S) / ED DIAGNOSES  Final diagnoses:  Right lower quadrant abdominal pain      NEW MEDICATIONS STARTED DURING THIS VISIT:  New Prescriptions   No medications on file     Note:  This document was prepared using Dragon voice recognition software and may include  unintentional dictation errors.    Dionne Bucy, MD 09/04/19 2202

## 2019-09-04 NOTE — ED Notes (Signed)
Pt taken to CT at this time.

## 2019-09-04 NOTE — ED Triage Notes (Signed)
Patient ambulatory to triage with steady gait, without difficulty, tearful; mask in place; Pt reports dx with UTI on Saturday and rx keflex; c/o persistent lower abd/back pain accomp by nausea

## 2019-09-05 ENCOUNTER — Other Ambulatory Visit: Payer: Self-pay | Admitting: Urology

## 2019-09-05 ENCOUNTER — Emergency Department: Payer: BLUE CROSS/BLUE SHIELD

## 2019-09-05 DIAGNOSIS — N201 Calculus of ureter: Secondary | ICD-10-CM

## 2019-09-05 MED ORDER — HYDROCODONE-ACETAMINOPHEN 5-325 MG PO TABS: 2.0000 | ORAL_TABLET | Freq: Four times a day (QID) | ORAL | 0 refills | Status: DC | PRN

## 2019-09-05 MED ORDER — OXYCODONE-ACETAMINOPHEN 5-325 MG PO TABS
2.0000 | ORAL_TABLET | Freq: Once | ORAL | Status: AC
  Administered 2019-09-05: 03:00:00 2 via ORAL
  Filled 2019-09-05: qty 2

## 2019-09-05 MED ORDER — TAMSULOSIN HCL 0.4 MG PO CAPS: ORAL_CAPSULE | ORAL | 0 refills | Status: AC

## 2019-09-05 MED ORDER — OXYCODONE-ACETAMINOPHEN 5-325 MG PO TABS
2.0000 | ORAL_TABLET | Freq: Once | ORAL | Status: DC
  Filled 2019-09-05: qty 2

## 2019-09-05 MED ORDER — ONDANSETRON 4 MG PO TBDP: ORAL_TABLET | ORAL | 0 refills | Status: AC

## 2019-09-05 NOTE — ED Notes (Signed)
Pt back in bed. Pt states she does not need anything at this time. Waiting on results from ultrasound.

## 2019-09-05 NOTE — Discharge Instructions (Signed)
You have been seen in the Emergency Department (ED) today for pain that we believe is caused by kidney stones.  However, you also had the two other issues (malpositioned IUD and right ovarian cyst) for which you will follow up with your OB/GYN as scheduled later today.    You may take pain medication as needed but ONLY as prescribed.  Please also take your prescribed Flomax daily.  Avoid taking ibuprofen, aspirin, or naproxen (NSAIDs) until you talk with the urologist to discuss whether or not you are a candidate for lithotripsy on Thursday.  Do not drink alcohol, drive or participate in any other potentially dangerous activities while taking opiate pain medication as it may make you sleepy. Do not take this medication with any other sedating medications, either prescription or over-the-counter. If you were prescribed Percocet or Vicodin, do not take these with acetaminophen (Tylenol) as it is already contained within these medications.   Take Percocet as needed for severe pain.  This medication is an opiate (or narcotic) pain medication and can be habit forming.  Use it as little as possible to achieve adequate pain control.  Do not use or use it with extreme caution if you have a history of opiate abuse or dependence.  If you are on a pain contract with your primary care doctor or a pain specialist, be sure to let them know you were prescribed this medication today from the [facility] Emergency Department.  This medication is intended for your use only - do not give any to anyone else and keep it in a secure place where nobody else, especially children, have access to it.  It will also cause or worsen constipation, so you may want to consider taking an over-the-counter stool softener while you are taking this medication.  Return to the Emergency Department (ED) or call your doctor if you have any worsening pain, fever, painful urination, are unable to urinate, or develop other symptoms that concern  you.

## 2019-09-05 NOTE — ED Notes (Signed)
Pt not in room at this time. Pt in imaging at this time.

## 2019-09-05 NOTE — ED Provider Notes (Signed)
----------------------------------------- 12:22 AM on 09/05/2019 -----------------------------------------  Assuming care from [facility].  In short, Stacy Richards is a 39 y.o. female with a chief complaint of flank and RLQ pain.  Refer to the original H&P for additional details.  The current plan of care is to follow up pelvic ultrasound.  The patient has an obstructive ureteral stone on CT scan that will need urology follow-up but her urinalysis is generally reassuring with no sign of infection.  She has probable malposition of IUD that the radiologist recommended outpatient OB/GYN follow-up.  She has a moderately sized right ovarian cyst so the current plan is to rule out torsion as one of the multiple causes of her right-sided pain.  Anticipate discharge with close outpatient follow-up with multiple services.   CT ABDOMEN PELVIS W CONTRAST  Result Date: 09/04/2019 CLINICAL DATA:  Right flank and right lower quadrant pain. Nausea. EXAM: CT ABDOMEN AND PELVIS WITH CONTRAST TECHNIQUE: Multidetector CT imaging of the abdomen and pelvis was performed using the standard protocol following bolus administration of intravenous contrast. CONTRAST:  OMNIPAQUE IOHEXOL 300 MG/ML  SOLN COMPARISON:  None. FINDINGS: Lower chest: Tiny subpleural nodule in the right middle lobe, likely post infectious or inflammatory in a patient of this age. No focal airspace disease or pleural fluid. The heart is normal in size. Hepatobiliary: Mild decreased hepatic density consistent with steatosis. No focal liver lesion. Gallbladder physiologically distended, no calcified stone. No biliary dilatation. Pancreas: No ductal dilatation or inflammation. Spleen: Normal in size without focal abnormality. Adrenals/Urinary Tract: Normal adrenal glands. Obstructing 4 x 6 mm stone in the distal right ureter just proximal to the ureterovesicular junction with moderate hydronephrosis. Mild right perinephric edema. No additional  renal calculi. Unremarkable appearance of the left kidney without hydronephrosis. The left ureter is decompressed. Urinary bladder is partially distended. No bladder stone. Stomach/Bowel: The stomach is unremarkable. Normal positioning of the ligament of Treitz. There is no small bowel obstruction or inflammation. Cecum is high-riding in the right mid abdomen. Terminal ileum is normal. Appendix is not definitively visualized, no evidence of appendicitis. Small to moderate volume of stool in the colon. Vascular/Lymphatic: Abdominal aorta is normal in caliber. The portal vein is patent. No enlarged lymph nodes in the abdomen or pelvis. Reproductive: Intrauterine device is abnormally positioned in the uterus. The IUD is rotated with the short arms directed AP. The stem is located in the lower uterine segment/cervix. One of the short arms extends into the myometrium and beyond the uterine contour, series 2, image 73. Septated cyst versus 2 adjacent cysts in the right ovary measuring 4.1 x 3.7 x 3.9 cm. No adjacent inflammatory change. Left ovary is not confidently visualized. Other: No free air, free fluid, or intra-abdominal fluid collection. Musculoskeletal: There are no acute or suspicious osseous abnormalities. Mild degenerative change in the lower lumbar spine with vacuum phenomenon. IMPRESSION: 1. Obstructing 4 x 6 mm stone in the distal right ureter just proximal to the ureterovesicular junction with moderate right hydronephrosis. 2. Abnormally positioned IUD in the uterus. The IUD is rotated with the short arms directed AP. One of the short arms extends into the myometrium and beyond the uterine contour. Recommend outpatient gyn consultation. 3. Septated cyst versus 2 adjacent cysts in the right ovary measuring 4.1 x 3.7 x 3.9 cm. No adjacent inflammatory change. This is likely physiologic in a patient of this age. 4. Mild hepatic steatosis. Electronically Signed   By: Narda Rutherford M.D.   On: 09/04/2019  23:10   US PELVIC COMPLETE W TRANSVAGINAL AND TORSION R/O  Result Date: 09/05/2019 CLINICAL DATA:  Initial evaluation for acute right lower quadrant pain for 1 week. History of prior left oophorectomy. EXAM: TRANSABDOMINAL AND TRANSVAGINAL ULTRASOUND OF PELVIS DOPPLER ULTRASOUND OF OVARIES TECHNIQUE: Both transabdominal and transvaginal ultrasound examinations of the pelvis were performed. Transabdominal technique was performed for global imaging of the pelvis including uterus, ovaries, adnexal regions, and pelvic cul-de-sac. It was necessary to proceed with endovaginal exam following the transabdominal exam to visualize the uterus, endometrium, and ovaries. Color and duplex Doppler ultrasound was utilized to evaluate blood flow to the ovaries. COMPARISON:  Prior CT from 09/04/2019. FINDINGS: Uterus Measurements: 10.3 x 3.6 x 6.1 cm = volume: 118.6 mL. No fibroids or other mass visualized. Endometrium Thickness: 3.8 mm. No focal abnormality visualized. IUD in place at the level of the lower uterine segment/cervix. IUD appears malpositioned with 1 of the arms extending into the myometrium, corresponding with abnormality on prior CT. Right ovary Measurements: 5.0 x 3.6 x 3.8 cm = volume: 35.8 mL. Right ovary is not well visualized due to body habitus. Complex cyst and/or adjacent cysts measuring up to approximately 5 cm in total seen involving the right ovary, indeterminate. Unclear whether this finding reflects a single complex septated cyst versus 2 adjacent cysts. Difficult to delineate ovarian tissue from adjacent cyst. Scattered areas of vascularity suspected to lie within the ovary itself, although difficult to be certain. Left ovary Surgically absent.  No adnexal mass. Pulsed Doppler evaluation of the right ovary demonstrates normal low-resistance arterial and venous waveforms. Other findings No abnormal free fluid. IMPRESSION: 1. Complex septated cyst versus 2 adjacent cysts involving the right ovary,  measuring up to approximately 5 cm in total dimension, indeterminate. Follow-up ultrasound in 6-12 weeks recommended for further evaluation. 2. No evidence for right ovarian torsion. 3. Malpositioned IUD at the level of the lower uterine segment/cervix, with extension into the myometrium, corresponding with findings seen on prior CT. 4. Prior left oophorectomy. Electronically Signed   By: Jeannine Boga M.D.   On: 09/05/2019 02:12    ----------------------------------------- 2:45 AM on 09/05/2019 -----------------------------------------  Generally reassuring results on ultrasound.  Malposition of IUD was confirmed but she already has an appointment scheduled for later today with her OB/GYN in [place].  There is no evidence of torsion on the ultrasound.  I encouraged her to discuss the results with her OB/GYN to talk about a repeat ultrasound in an appropriate interval (6 to 12 weeks per radiology) to make sure that the cyst has resolved.  I had my usual and customary ureteral stone discussion with the patient and encouraged her to call urology today to discuss whether or not she would be an appropriate candidate for lithotripsy.  I warned her against using NSAIDs at least until she follows up with urology.  She is getting a dose of Percocet 2 tablets by mouth prior to discharge although she has mild pain at this time.  I sent prescriptions for Norco, Zofran, and Flomax to her pharmacy electronically.  I gave my usual and customary return precautions.   Hinda Kehr, MD 09/05/19 [phone]

## 2019-09-10 ENCOUNTER — Encounter: Payer: Self-pay | Admitting: Urology

## 2019-09-10 ENCOUNTER — Ambulatory Visit
Admission: RE | Admit: 2019-09-10 | Discharge: 2019-09-10 | Disposition: A | Discharge status: Home / Self Care | Payer: BLUE CROSS/BLUE SHIELD | Source: Ambulatory Visit | Attending: Urology | Admitting: Urology

## 2019-09-10 ENCOUNTER — Ambulatory Visit (INDEPENDENT_AMBULATORY_CARE_PROVIDER_SITE_OTHER): Payer: BLUE CROSS/BLUE SHIELD | Admitting: Urology

## 2019-09-10 ENCOUNTER — Other Ambulatory Visit: Payer: Self-pay

## 2019-09-10 VITALS — BP 140/96 | HR 94 | Ht 65.0 in

## 2019-09-10 DIAGNOSIS — N926 Irregular menstruation, unspecified: Secondary | ICD-10-CM | POA: Insufficient documentation

## 2019-09-10 DIAGNOSIS — N201 Calculus of ureter: Secondary | ICD-10-CM

## 2019-09-10 MED ORDER — HYDROCODONE-ACETAMINOPHEN 5-325 MG PO TABS: 2.0000 | ORAL_TABLET | Freq: Four times a day (QID) | ORAL | 0 refills | Status: AC | PRN

## 2019-09-10 NOTE — Progress Notes (Signed)
09/10/19 12:45 PM   Stacy Richards [birth year] [id number]  CC: Right distal ureteral stone  HPI: I saw Stacy Richards in urology clinic today for evaluation of a 6 mm right distal ureteral stone.  She is a 39 year old female with history notable for morbid obesity who reports 3 to 4 weeks of intermittent right-sided groin and flank pain, as well as some urgency and frequency with urination.  She was originally seen by her PCP on 4/17 and urinalysis was contaminated with squamous cells, showed 6-10 WBCs, and many bacteria.  She was started on Keflex for possible UTI, however culture was not sent.  She was not having any fevers or dysuria at that time.  She had persistence of her pain and was ultimately seen in the ER on 09/04/2019 and a CT scan showed a large 6 mm right distal ureteral stone with upstream hydronephrosis.  Urinalysis at that time showed rare bacteria, 6-10 WBCs, nitrite negative, trace leukocytes, greater than 50 RBCs.  Culture was not sent.  She was discharged with medical expulsive therapy.  She reports ongoing intermittent right-sided groin pain occasionally requiring narcotics.  KUB today shows likely persistence of the stone in the right distal ureter.  On CT, skin to stone distance is 18 cm, 360HU, and urine pH 5 suspicious for uric acid stone.  She denies any fevers or chills.  Urinalysis today with 6-10 WBCs, greater than 30 RBCs, few bacteria, no yeast, nitrite negative, trace leukocytes.  PMH: Past Medical History:  Diagnosis Date  . Anxiety   . Depression   . GERD (gastroesophageal reflux disease)   . History of cesarean section complicating pregnancy 37/10/2829  . History of pre-eclampsia   . HTN in pregnancy, chronic 02/17/2016  . Hypertension   . Kidney stone   . S/P cesarean section 02/18/2016    Surgical History: Past Surgical History:  Procedure Laterality Date  . CESAREAN SECTION    . CESAREAN SECTION N/A 02/18/2016   Procedure: CESAREAN SECTION;  Surgeon: Janyth Contes, MD;  Location: [facility];  Service: Obstetrics;  Laterality: N/A;    Family History: Family History  Problem Relation Age of Onset  . Hypertension Mother   . Hypertension Father   . COPD Paternal Uncle        lung  . Hypertension Maternal Grandmother   . Skin cancer Maternal Grandmother   . Hypertension Maternal Grandfather   . Hypertension Paternal Grandmother   . Skin cancer Paternal Grandmother   . Hypertension Paternal Grandfather   . COPD Paternal Grandfather        lung    Social History:  reports that she has never smoked. She has never used smokeless tobacco. She reports that she does not drink alcohol or use drugs.  Physical Exam: BP (!) 140/96 (BP Location: Left Arm, Patient Position: Sitting, Cuff Size: Large)   Pulse 94   Ht 5\' 5"  (1.651 m)   LMP  (LMP Unknown)   BMI 45.76 kg/m    Constitutional:  Alert and oriented, No acute distress. Cardiovascular: No clubbing, cyanosis, or edema. Respiratory: Normal respiratory effort, no increased work of breathing. GI: Abdomen is soft, nontender, nondistended, no abdominal masses GU: right CVA tenderness  Laboratory Data: Reviewed, see HPI  Pertinent Imaging: I have personally reviewed the CT stone protocol and KUB, see HPI for results  Assessment & Plan:   In summary, she is a 39 year old female with 3 to 4 weeks of intermittent right-sided flank and groin pain  secondary to a 6 mm right distal ureteral stone.  She has no clinical or laboratory signs of infection.  We discussed various treatment options for urolithiasis including observation with or without medical expulsive therapy, shockwave lithotripsy (SWL), ureteroscopy and laser lithotripsy with stent placement, and percutaneous nephrolithotomy.  We discussed that management is based on stone size, location, density, patient co-morbidities, and patient preference.   Stones <46mm in size have a >80% spontaneous passage rate. Data  surrounding the use of tamsulosin for medical expulsive therapy is controversial, but meta analyses suggests it is most efficacious for distal stones between 5-64mm in size. Possible side effects include dizziness/lightheadedness, and retrograde ejaculation.  SWL has a lower stone free rate in a single procedure, but also a lower complication rate compared to ureteroscopy and avoids a stent and associated stent related symptoms. Possible complications include renal hematoma, steinstrasse, and need for additional treatment.  Ureteroscopy with laser lithotripsy and stent placement has a higher stone free rate than SWL in a single procedure, however increased complication rate including possible infection, ureteral injury, bleeding, and stent related morbidity. Common stent related symptoms include dysuria, urgency/frequency, and flank pain.  After an extensive discussion of the risks and benefits of the above treatment options, the patient would like to proceed with right ureteroscopy, laser lithotripsy, and stent placement.  Schedule right ureteroscopy, laser lithotripsy, stent placement   Legrand Rams, MD 09/10/2019  [facility] [address], [address] [place], [place] [address] [phone]

## 2019-09-10 NOTE — Patient Instructions (Signed)
Laser Therapy for Kidney Stones Laser therapy for kidney stones is a procedure to break up small, hard mineral deposits that form in the kidney (kidney stones). The procedure is done using a device that produces a focused beam of light (laser). The laser breaks up kidney stones into pieces that are small enough to be passed out of the body through urination or removed from the body during the procedure. You may need laser therapy if you have kidney stones that are painful or block your urinary tract. This procedure is done by inserting a tube (ureteroscope) into your kidney through the urethral opening. The urethra is the part of the body that drains urine from the bladder. In women, the urethra opens above the vaginal opening. In men, the urethra opens at the tip of the penis. The ureteroscope is inserted through the urethra, and surgical instruments are moved through the bladder and the muscular tube that connects the kidney to the bladder (ureter) until they reach the kidney. Tell a health care provider about:  Any allergies you have.  All medicines you are taking, including vitamins, herbs, eye drops, creams, and over-the-counter medicines.  Any problems you or family members have had with anesthetic medicines.  Any blood disorders you have.  Any surgeries you have had.  Any medical conditions you have.  Whether you are pregnant or may be pregnant. What are the risks? Generally, this is a safe procedure. However, problems may occur, including:  Infection.  Bleeding.  Allergic reactions to medicines.  Damage to the urethra, bladder, or ureter.  Urinary tract infection (UTI).  Narrowing of the urethra (urethral stricture).  Difficulty passing urine.  Blockage of the kidney caused by a fragment of kidney stone. What happens before the procedure? Medicines  Ask your health care provider about: ? Changing or stopping your regular medicines. This is especially important if you  are taking diabetes medicines or blood thinners. ? Taking medicines such as aspirin and ibuprofen. These medicines can thin your blood. Do not take these medicines unless your health care provider tells you to take them. ? Taking over-the-counter medicines, vitamins, herbs, and supplements. Eating and drinking Follow instructions from your health care provider about eating and drinking, which may include:  8 hours before the procedure - stop eating heavy meals or foods, such as meat, fried foods, or fatty foods.  6 hours before the procedure - stop eating light meals or foods, such as toast or cereal.  6 hours before the procedure - stop drinking milk or drinks that contain milk.  2 hours before the procedure - stop drinking clear liquids. Staying hydrated Follow instructions from your health care provider about hydration, which may include:  Up to 2 hours before the procedure - you may continue to drink clear liquids, such as water, clear fruit juice, black coffee, and plain tea.  General instructions  You may have a physical exam before the procedure. You may also have tests, such as imaging tests and blood or urine tests.  If your ureter is too narrow, your health care provider may place a soft, flexible tube (stent) inside of it. The stent may be placed days or weeks before your laser therapy procedure.  Plan to have someone take you home from the hospital or clinic.  If you will be going home right after the procedure, plan to have someone stay with you for 24 hours.  Do not use any products that contain nicotine or tobacco for at least 4   weeks before the procedure. These products include cigarettes, e-cigarettes, and chewing tobacco. If you need help quitting, ask your health care provider.  Ask your health care provider: ? How your surgical site will be marked or identified. ? What steps will be taken to help prevent infection. These may include:  Removing hair at the surgery  site.  Washing skin with a germ-killing soap.  Taking antibiotic medicine. What happens during the procedure?   An IV will be inserted into one of your veins.  You will be given one or more of the following: ? A medicine to help you relax (sedative). ? A medicine to numb the area (local anesthetic). ? A medicine to make you fall asleep (general anesthetic).  A ureteroscope will be inserted into your urethra. The ureteroscope will send images to a video screen in the operating room to guide your surgeon to the area of your kidney that will be treated.  A small, flexible tube will be threaded through the ureteroscope and into your bladder and ureter, up to your kidney.  The laser device will be inserted into your kidney through the tube. Your surgeon will pulse the laser on and off to break up kidney stones.  A surgical instrument that has a tiny wire basket may be inserted through the tube into your kidney to remove the pieces of broken kidney stone. The procedure may vary among health care providers and hospitals. What happens after the procedure?  Your blood pressure, heart rate, breathing rate, and blood oxygen level will be monitored until you leave the hospital or clinic.  You will be given pain medicine as needed.  You may continue to receive antibiotics.  You may have a stent temporarily placed in your ureter.  Do not drive for 24 hours if you were given a sedative during your procedure.  You may be given a strainer to collect any stone fragments that you pass in your urine. Your health care provider may have these tested. Summary  Laser therapy for kidney stones is a procedure to break up kidney stones into pieces that are small enough to be passed out of the body through urination or removed during the procedure.  Follow instructions from your health care provider about eating and drinking before the procedure.  During the procedure, the ureteroscope will send images  to a video screen to guide your surgeon to the area of your kidney that will be treated.  Do not drive for 24 hours if you were given a sedative during your procedure. This information is not intended to replace advice given to you by your health care provider. Make sure you discuss any questions you have with your health care provider. Document Revised: 01/12/2018 Document Reviewed: 01/12/2018 Elsevier Patient Education  2020 [employer]   Ureteral Stent Implantation  Ureteral stent implantation is a procedure to insert (implant) a flexible, soft, plastic tube (stent) into a ureter. Ureters are the tube-like parts of the body that drain urine from the kidneys. The stent supports the ureter while it heals and helps to drain urine. You may have a ureteral stent implanted after having a procedure to remove a blockage from the ureter (ureterolysis or pyeloplasty). You may also have a stent implanted to open the flow of urine when you have a blockage caused by a kidney stone, tumor, blood clot, or infection. You have two ureters, one on each side of the body. The ureters connect the kidneys to the organ that holds urine   until it passes out of the body (bladder). The stent is placed so that one end is in the kidney, and one end is in the bladder. The stent is usually taken out after your ureter has healed. Depending on your condition, you may have a stent for just a few weeks, or you may have a long-term stent that will need to be replaced every few months. Tell a health care provider about:  Any allergies you have.  All medicines you are taking, including vitamins, herbs, eye drops, creams, and over-the-counter medicines.  Any problems you or family members have had with anesthetic medicines.  Any blood disorders you have.  Any surgeries you have had.  Any medical conditions you have.  Whether you are pregnant or may be pregnant. What are the risks? Generally, this is a safe procedure.  However, problems may occur, including:  Infection.  Bleeding.  Allergic reactions to medicines.  Damage to other structures or organs. Tearing (perforation) of the ureter is possible.  Movement of the stent away from where it is placed during surgery (migration). What happens before the procedure? Medicines Ask your health care provider about:  Changing or stopping your regular medicines. This is especially important if you are taking diabetes medicines or blood thinners.  Taking medicines such as aspirin and ibuprofen. These medicines can thin your blood. Do not take these medicines unless your health care provider tells you to take them.  Taking over-the-counter medicines, vitamins, herbs, and supplements. Eating and drinking Follow instructions from your health care provider about eating and drinking, which may include:  8 hours before the procedure - stop eating heavy meals or foods, such as meat, fried foods, or fatty foods.  6 hours before the procedure - stop eating light meals or foods, such as toast or cereal.  6 hours before the procedure - stop drinking milk or drinks that contain milk.  2 hours before the procedure - stop drinking clear liquids. Staying hydrated Follow instructions from your health care provider about hydration, which may include:  Up to 2 hours before the procedure - you may continue to drink clear liquids, such as water, clear fruit juice, black coffee, and plain tea. General instructions  Do not drink alcohol.  Do not use any products that contain nicotine or tobacco for at least 4 weeks before the procedure. These products include cigarettes, e-cigarettes, and chewing tobacco. If you need help quitting, ask your health care provider.  You may have an exam or testing, such as imaging or blood tests.  Ask your health care provider what steps will be taken to help prevent infection. These may include: ? Removing hair at the surgery  site. ? Washing skin with a germ-killing soap. ? Taking antibiotic medicine.  Plan to have someone take you home from the hospital or clinic.  If you will be going home right after the procedure, plan to have someone with you for 24 hours. What happens during the procedure?  An IV will be inserted into one of your veins.  You may be given a medicine to help you relax (sedative).  You may be given a medicine to make you fall asleep (general anesthetic).  A thin, tube-shaped instrument with a light and tiny camera at the end (cystoscope) will be inserted into your urethra. The urethra is the tube that drains urine from the bladder out of the body. In men, the urethra opens at the end of the penis. In women, the urethra opens   in front of the vaginal opening.  The cystoscope will be passed into your bladder.  A thin wire (guide wire) will be passed through your bladder and into your ureter. This is used to guide the stent into your ureter.  The stent will be inserted into your ureter.  The guide wire and the cystoscope will be removed.  A flexible tube (catheter) may be inserted through your urethra so that one end is in your bladder. This helps to drain urine from your bladder. The procedure may vary among hospitals and health care providers. What happens after the procedure?  Your blood pressure, heart rate, breathing rate, and blood oxygen level will be monitored until you leave the hospital or clinic.  You may continue to receive medicine and fluids through an IV.  You may have some soreness or pain in your abdomen and urethra. Medicines will be available to help you.  You will be encouraged to get up and walk around as soon as you can.  You may have a catheter draining your urine.  You will have some blood in your urine.  Do not drive for 24 hours if you were given a sedative during your procedure. Summary  Ureteral stent implantation is a procedure to insert a flexible,  soft, plastic tube (stent) into a ureter.  You may have a stent implanted to support the ureter while it heals after a procedure or to open the flow of urine if there is a blockage.  Follow instructions from your health care provider about taking medicines and about eating and drinking before the procedure.  Depending on your condition, you may have a stent for just a few weeks, or you may have a long-term stent that will need to be replaced every few months. This information is not intended to replace advice given to you by your health care provider. Make sure you discuss any questions you have with your health care provider. Document Revised: 02/07/2018 Document Reviewed: 02/08/2018 Elsevier Patient Education  2020 [employer]   

## 2019-09-11 LAB — URINALYSIS, COMPLETE
Bilirubin, UA: NEGATIVE
Glucose, UA: NEGATIVE
Ketones, UA: NEGATIVE
Nitrite, UA: NEGATIVE
Specific Gravity, UA: 1.025 (ref 1.005–1.030)
Urobilinogen, Ur: 1 mg/dL (ref 0.2–1.0)
pH, UA: 7.5 (ref 5.0–7.5)

## 2019-09-11 LAB — MICROSCOPIC EXAMINATION: RBC, Urine: >30 /hpf — AB (ref 0–2)

## 2019-09-12 ENCOUNTER — Other Ambulatory Visit: Payer: Self-pay | Admitting: Urology

## 2019-09-12 ENCOUNTER — Encounter
Admission: RE | Admit: 2019-09-12 | Discharge: 2019-09-12 | Disposition: A | Discharge status: Home / Self Care | Payer: BLUE CROSS/BLUE SHIELD | Source: Ambulatory Visit | Attending: Urology | Admitting: Urology

## 2019-09-12 ENCOUNTER — Other Ambulatory Visit: Payer: Self-pay

## 2019-09-12 DIAGNOSIS — N201 Calculus of ureter: Secondary | ICD-10-CM

## 2019-09-12 NOTE — Patient Instructions (Signed)
Your procedure is scheduled on: Friday September 14, 2019 Report to Day Surgery inside [facility]. To find out your arrival time please call [phone] between 1PM - 3PM on Thursday September 13, 2019.  Remember: Instructions that are not followed completely may result in serious medical risk,  up to and including death, or upon the discretion of your surgeon and anesthesiologist your  surgery may need to be rescheduled.     _X__ 1. Do not eat food after midnight the night before your procedure.                 No gum chewing or hard candies. You may drink clear liquids up to 2 hours                 before you are scheduled to arrive for your surgery- DO not drink clear                 liquids within 2 hours of the start of your surgery.                 Clear Liquids include:  water, apple juice without pulp, clear Gatorade, G2 or                  Gatorade Zero (avoid Red/Purple/Blue), Black Coffee or Tea (Do not add                 anything to coffee or tea).  __X__2.  On the morning of surgery brush your teeth with toothpaste and water, you                may rinse your mouth with mouthwash if you wish.  Do not swallow any toothpaste of mouthwash.     _X__ 3.  No Alcohol for 24 hours before or after surgery.   _X__ 4.  Do Not Smoke or use e-cigarettes For 24 Hours Prior to Your Surgery.                 Do not use any chewable tobacco products for at least 6 hours prior to                 Surgery.  _X__  5.  Do not use any recreational drugs (marijuana, cocaine, heroin, ecstacy, MDMA or other)                For at least one week prior to your surgery.  Combination of these drugs with anesthesia                May have life threatening results.  __X__ 6.  Notify your doctor if there is any change in your medical condition      (cold, fever, infections).     Do not wear jewelry, make-up, hairpins, clips or nail polish. Do not wear lotions, powders, or perfumes.  You may wear deodorant. Do not shave 48 hours prior to surgery. Men may shave face and neck. Do not bring valuables to the hospital.    [facility] is not responsible for any belongings or valuables.  Contacts, dentures or bridgework may not be worn into surgery. Leave your suitcase in the car. After surgery it may be brought to your room. For patients admitted to the hospital, discharge time is determined by your treatment team.   Patients discharged the day of surgery will not be allowed to drive home.   Make arrangements for someone to be with you  for the first 24 hours of your Same Day Discharge.   __X__ Take these medicines the morning of surgery with A SIP OF WATER:    1. buPROPion (WELLBUTRIN XL) 300  2. esomeprazole (NEXIUM) 40 MG  3. HYDROcodone-acetaminophen (NORCO/VICODIN) 5-325 MG  4.venlafaxine XR (EFFEXOR-XR) 150    __X__ Stop Anti-inflammatories such ibuprofen, Aleve, naproxen, aspirin and or BC powders.   __X__ Stop supplements until after surgery.    __X__ Do not start any herbal supplements before your surgery.

## 2019-09-13 ENCOUNTER — Other Ambulatory Visit: Admission: RE | Admit: 2019-09-13 | Payer: BLUE CROSS/BLUE SHIELD | Source: Ambulatory Visit

## 2019-09-14 ENCOUNTER — Ambulatory Visit: Admission: RE | Admit: 2019-09-14 | Payer: BLUE CROSS/BLUE SHIELD | Source: Home / Self Care | Admitting: Urology

## 2019-09-14 ENCOUNTER — Encounter: Admission: RE | Payer: Self-pay | Source: Home / Self Care

## 2019-09-14 SURGERY — CYSTOSCOPY/URETEROSCOPY/HOLMIUM LASER/STENT PLACEMENT
Anesthesia: Choice | Laterality: Right

## 2021-01-23 IMAGING — CT CT ABD-PELV W/ CM
2 of 4 series · 16 of 46 positions shown, 18 images · IV contrast (APPLIED)
Comparison: None.

CLINICAL DATA: Right flank and right lower quadrant pain. Nausea.

EXAM:
CT ABDOMEN AND PELVIS WITH CONTRAST
TECHNIQUE: Multidetector CT imaging of the abdomen and pelvis was performed
using the standard protocol following bolus administration of
intravenous contrast.
CONTRAST:  100mL OMNIPAQUE IOHEXOL 300 MG/ML  SOLN

[Series 2: axial st · axial · 0.98mm/px · z∈[-227,+228]mm · 13 of 101 slices shown, 15 images]
[im 5/101  soft-tissue]
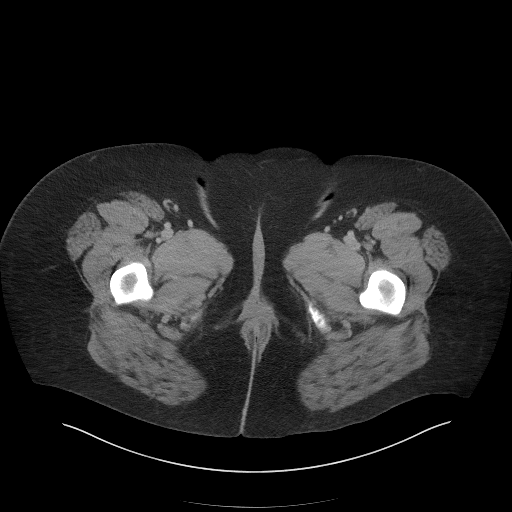
[im 5/101  bone]
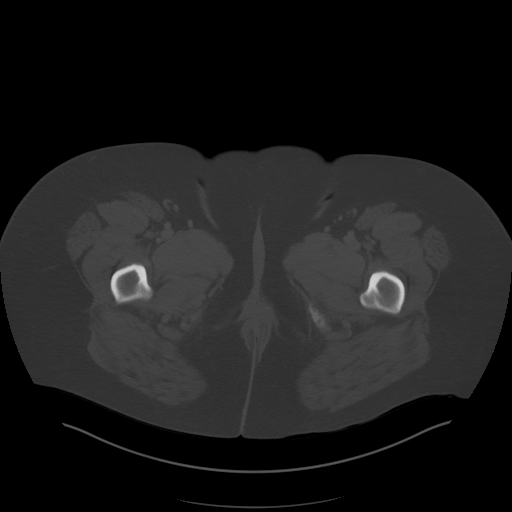
[im 13/101  soft-tissue]
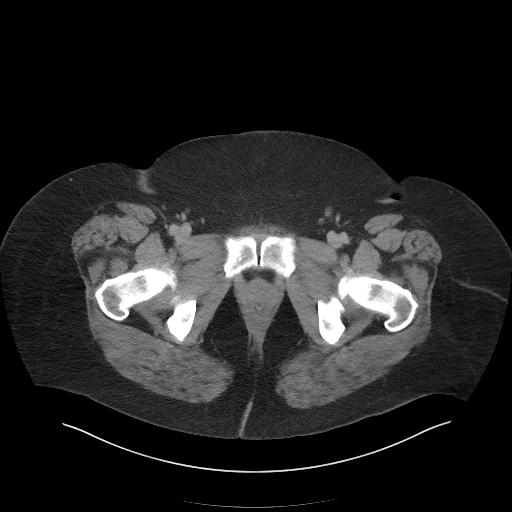
[im 21/101  soft-tissue]
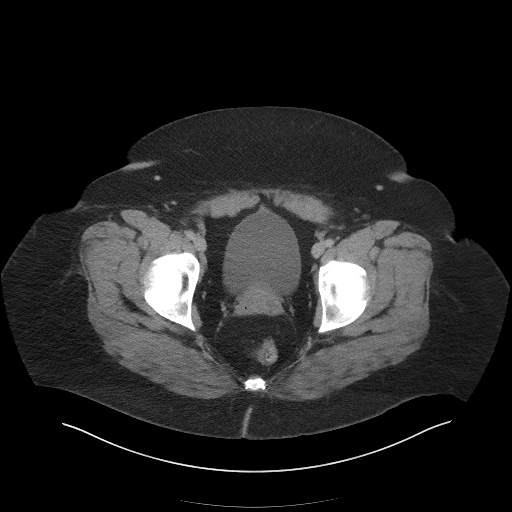
[im 30/101  soft-tissue]
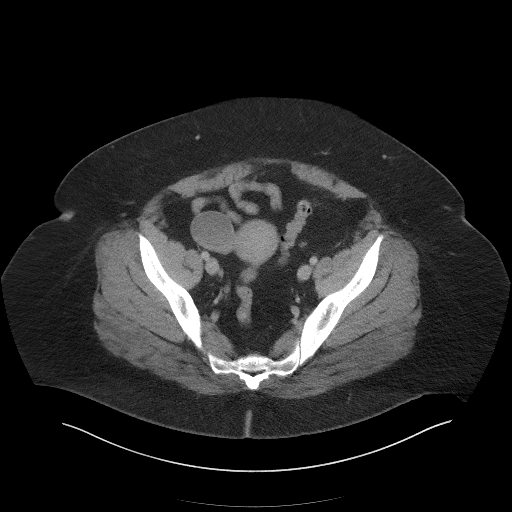
[im 34/101  soft-tissue]
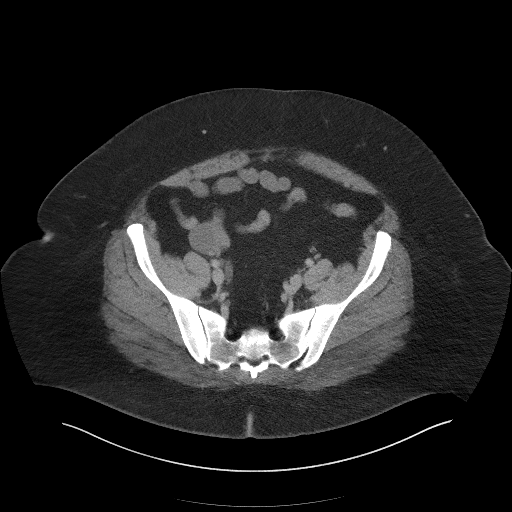
[im 42/101  soft-tissue]
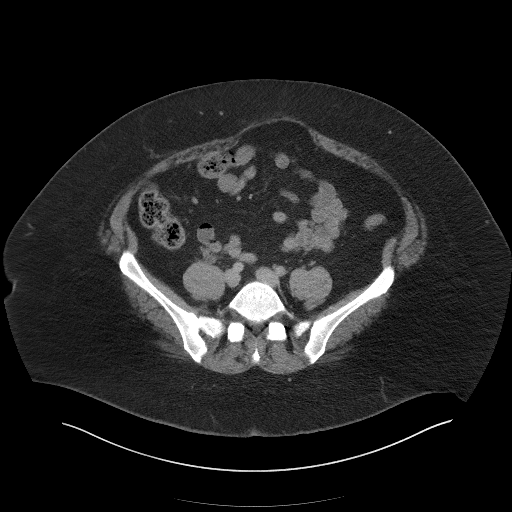
[im 51/101  soft-tissue]
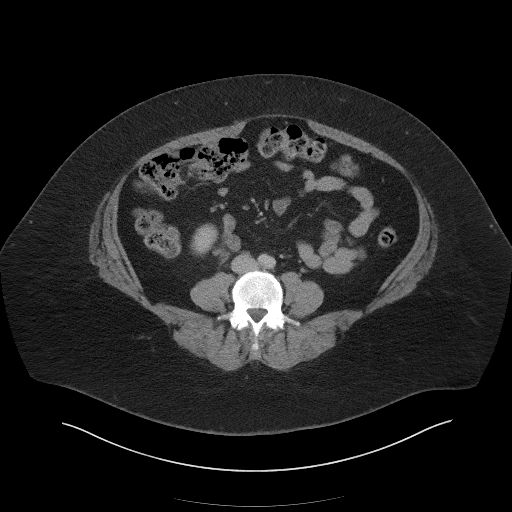
[im 59/101  soft-tissue]
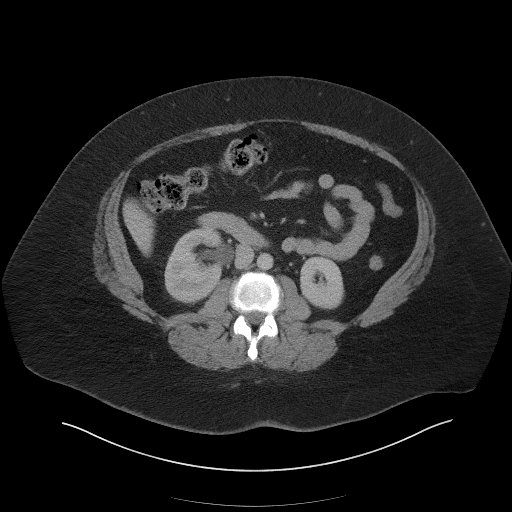
[im 67/101  soft-tissue]
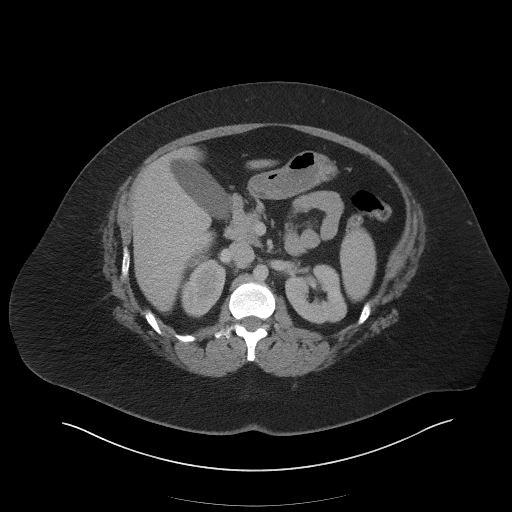
[im 67/101  bone]
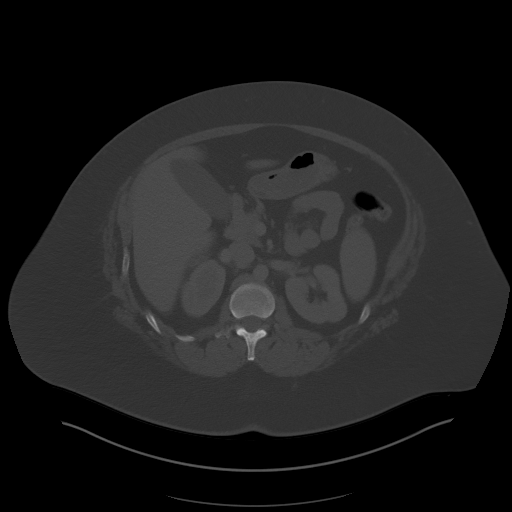
[im 71/101  soft-tissue]
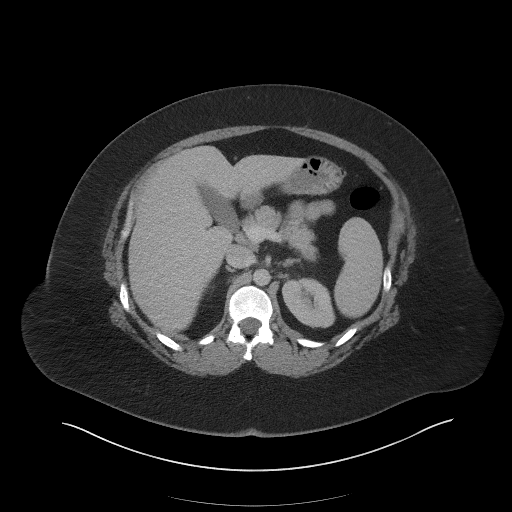
[im 80/101  soft-tissue]
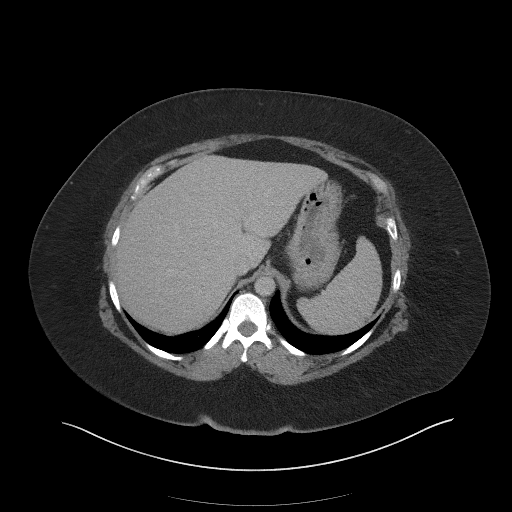
[im 88/101  soft-tissue]
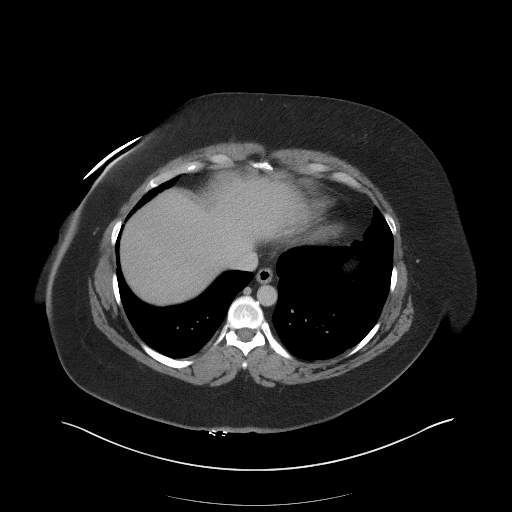
[im 96/101  soft-tissue]
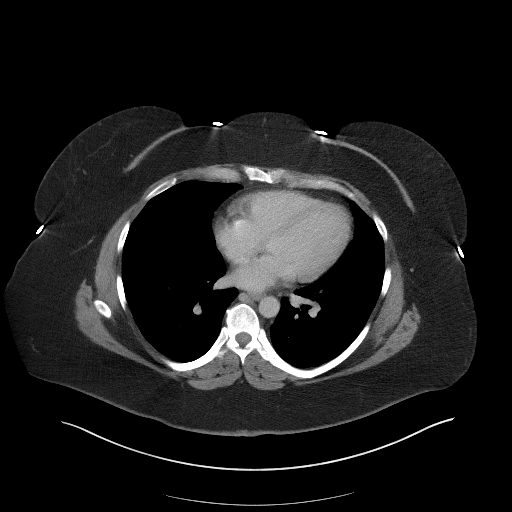

[Series 5: coronal st · coronal · 0.98mm/px · 3 of 114 slices shown]
[im 38/114  soft-tissue]
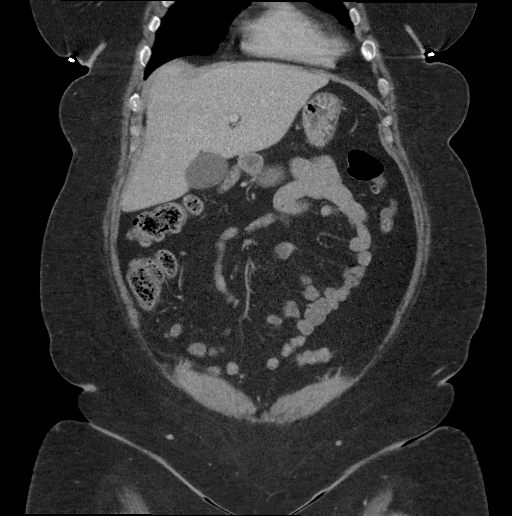
[im 51/114  soft-tissue]
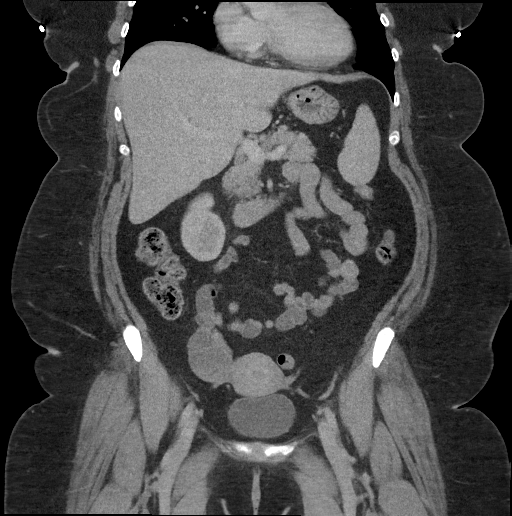
[im 63/114  soft-tissue]
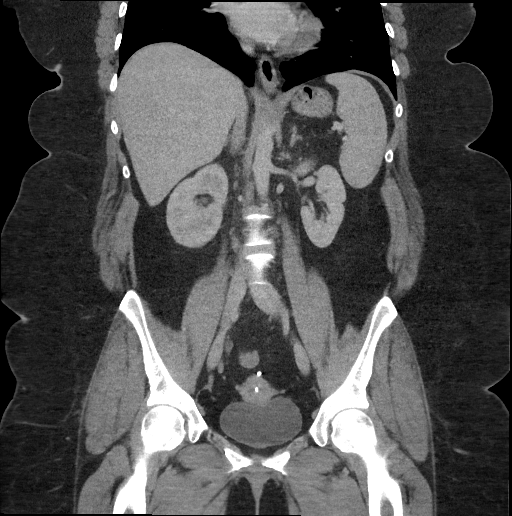

[16 of 46 positions shown; findings below may reference images not displayed]

FINDINGS: Lower chest: Tiny subpleural nodule in the right middle lobe, likely
post infectious or inflammatory in a patient of this age. No focal
airspace disease or pleural fluid. The heart is normal in size.

Hepatobiliary: Mild decreased hepatic density consistent with
steatosis. No focal liver lesion. Gallbladder physiologically
distended, no calcified stone. No biliary dilatation.

Pancreas: No ductal dilatation or inflammation.

Spleen: Normal in size without focal abnormality.

Adrenals/Urinary Tract: Normal adrenal glands.

Obstructing 4 x 6 mm stone in the distal right ureter just proximal
to the ureterovesicular junction with moderate hydronephrosis. Mild
right perinephric edema. No additional renal calculi. Unremarkable
appearance of the left kidney without hydronephrosis. The left
ureter is decompressed. Urinary bladder is partially distended. No
bladder stone.

Stomach/Bowel: The stomach is unremarkable. Normal positioning of
the ligament of Treitz. There is no small bowel obstruction or
inflammation. Cecum is high-riding in the right mid abdomen.
Terminal ileum is normal. Appendix is not definitively visualized,
no evidence of appendicitis. Small to moderate volume of stool in
the colon.

Vascular/Lymphatic: Abdominal aorta is normal in caliber. The portal
vein is patent. No enlarged lymph nodes in the abdomen or pelvis.

Reproductive: Intrauterine device is abnormally positioned in the
uterus. The IUD is rotated with the short arms directed AP. The stem
is located in the lower uterine segment/cervix. One of the short
arms extends into the myometrium and beyond the uterine contour,
series 2, image 73. Septated cyst versus 2 adjacent cysts in the
right ovary measuring 4.1 x 3.7 x 3.9 cm. No adjacent inflammatory
change. Left ovary is not confidently visualized.

Other: No free air, free fluid, or intra-abdominal fluid collection.

Musculoskeletal: There are no acute or suspicious osseous
abnormalities. Mild degenerative change in the lower lumbar spine
with vacuum phenomenon.
IMPRESSION: 1. Obstructing 4 x 6 mm stone in the distal right ureter just
proximal to the ureterovesicular junction with moderate right
hydronephrosis.
2. Abnormally positioned IUD in the uterus. The IUD is rotated with
the short arms directed AP. One of the short arms extends into the
myometrium and beyond the uterine contour. Recommend outpatient gyn
consultation.
3. Septated cyst versus 2 adjacent cysts in the right ovary
measuring 4.1 x 3.7 x 3.9 cm. No adjacent inflammatory change. This
is likely physiologic in a patient of this age.
4. Mild hepatic steatosis.

## 2021-01-24 IMAGING — US US PELVIS COMPLETE TRANSABD/TRANSVAG W DUPLEX
1 series · 13 of 25 positions shown · non-contrast
Comparison: Prior CT from 09/04/2019.

CLINICAL DATA: Initial evaluation for acute right lower quadrant
pain for 1 week. History of prior left oophorectomy.

EXAM:
TRANSABDOMINAL AND TRANSVAGINAL ULTRASOUND OF PELVIS
DOPPLER ULTRASOUND OF OVARIES
TECHNIQUE: Both transabdominal and transvaginal ultrasound examinations of the
pelvis were performed. Transabdominal technique was performed for
global imaging of the pelvis including uterus, ovaries, adnexal
regions, and pelvic cul-de-sac.
It was necessary to proceed with endovaginal exam following the
transabdominal exam to visualize the uterus, endometrium, and
ovaries. Color and duplex Doppler ultrasound was utilized to
evaluate blood flow to the ovaries.

[Series 1: us pelvic complete w transvaginal and torsion righ · 13 of 101 slices shown]
[im 1/101]
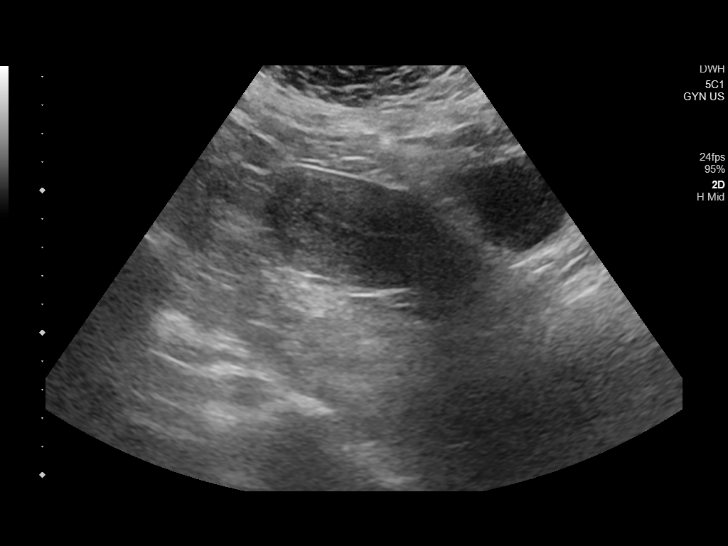
[im 9/101]
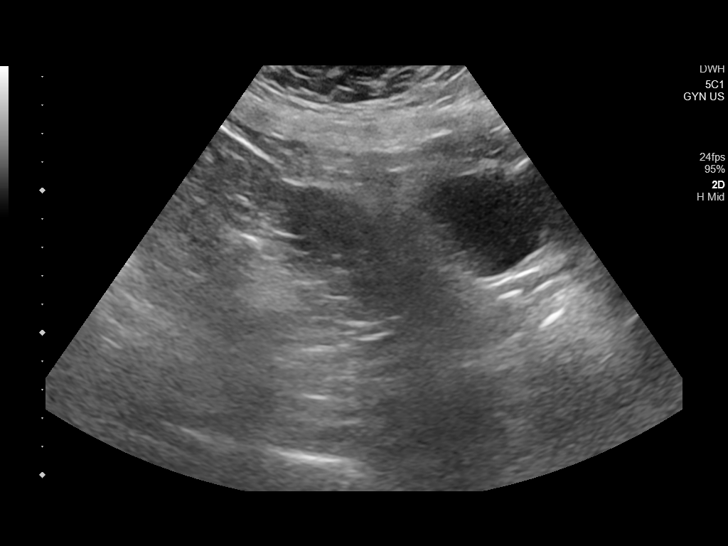
[im 17/101]
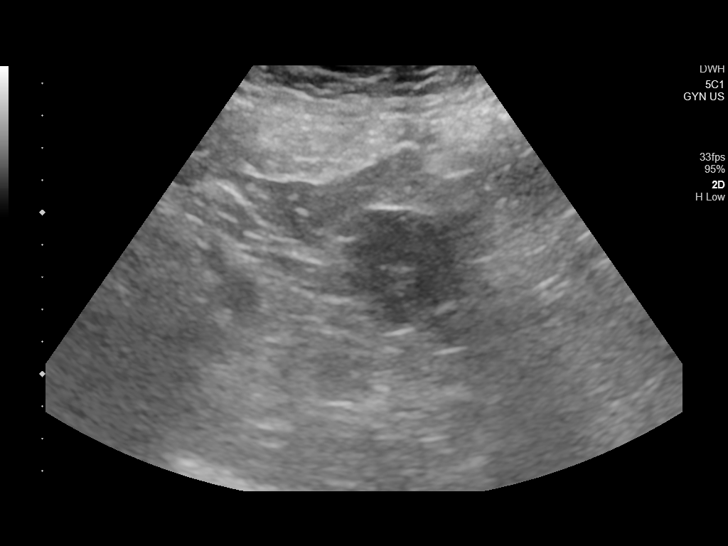
[im 26/101]
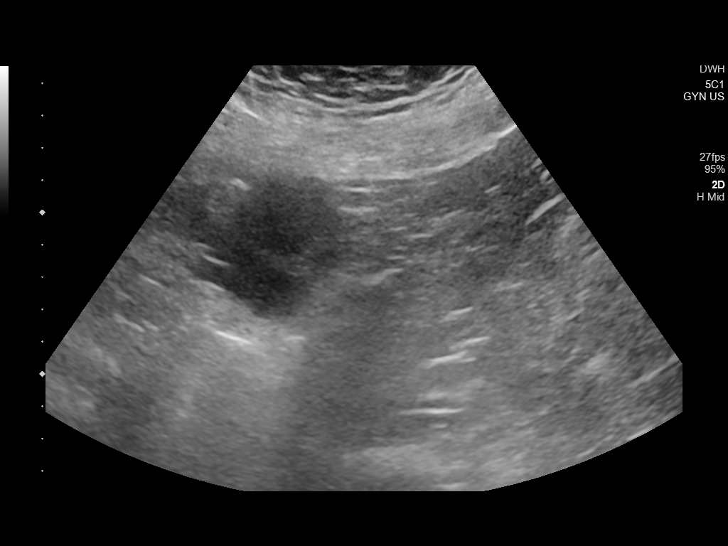
[im 34/101]
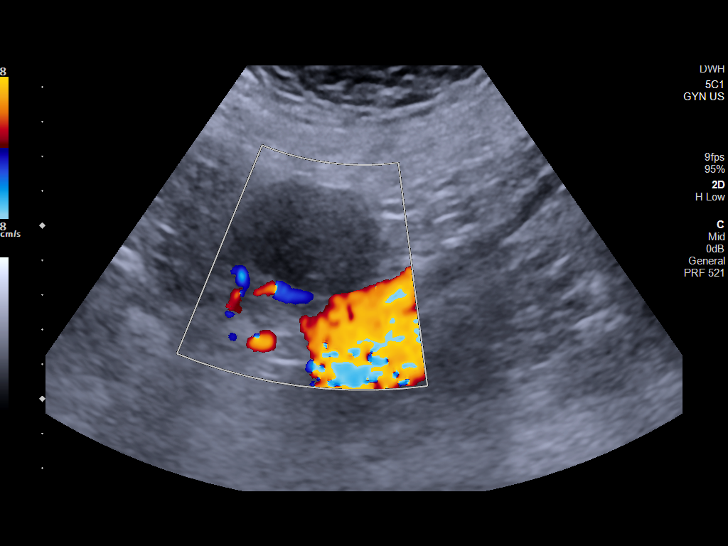
[im 42/101]
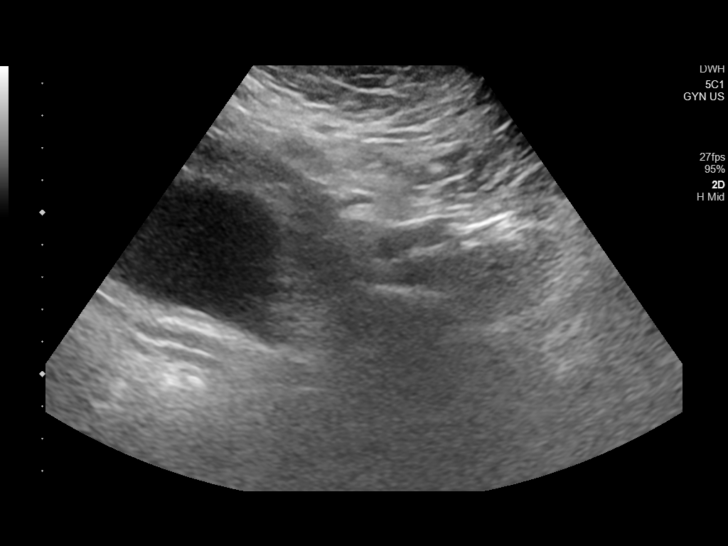
[im 51/101]
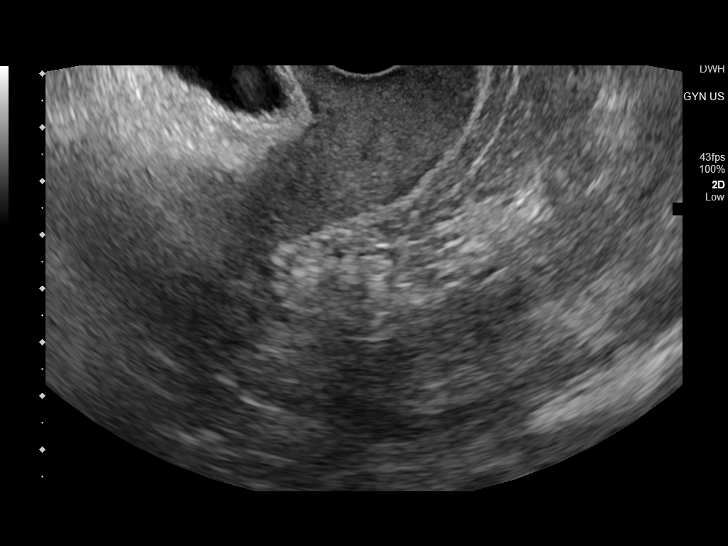
[im 59/101]
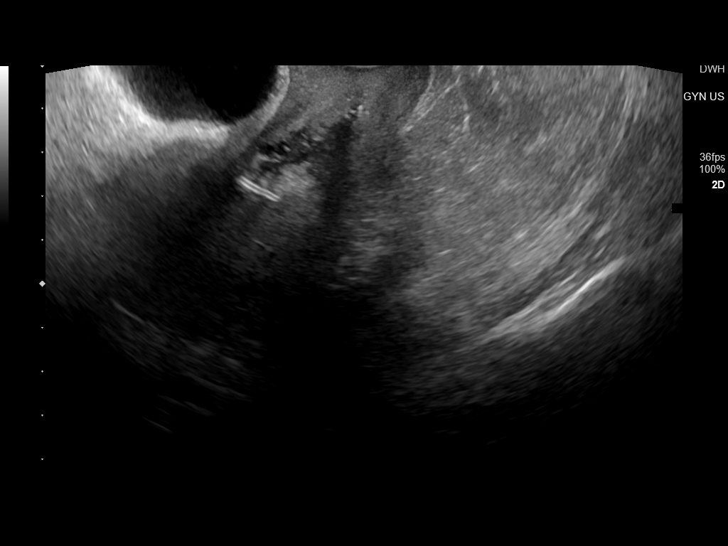
[im 67/101]
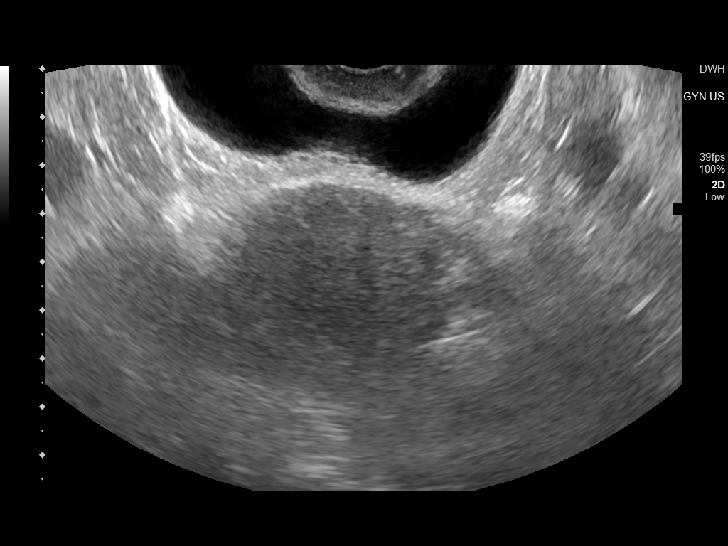
[im 76/101]
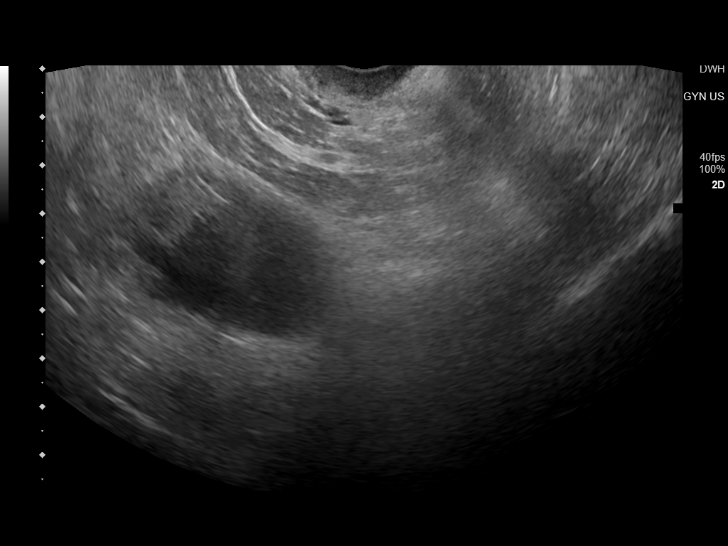
[im 84/101]
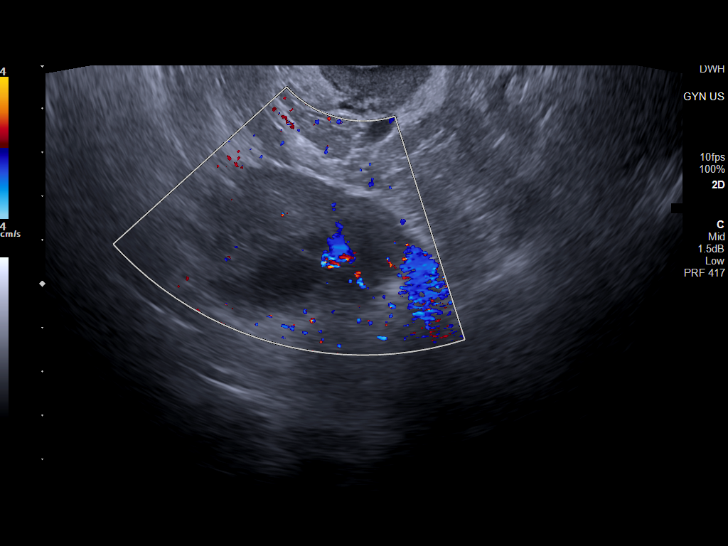
[im 92/101]
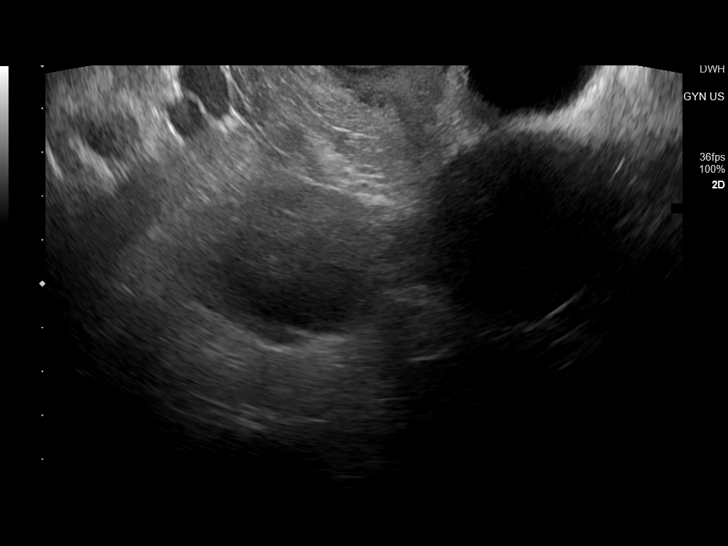
[im 101/101]
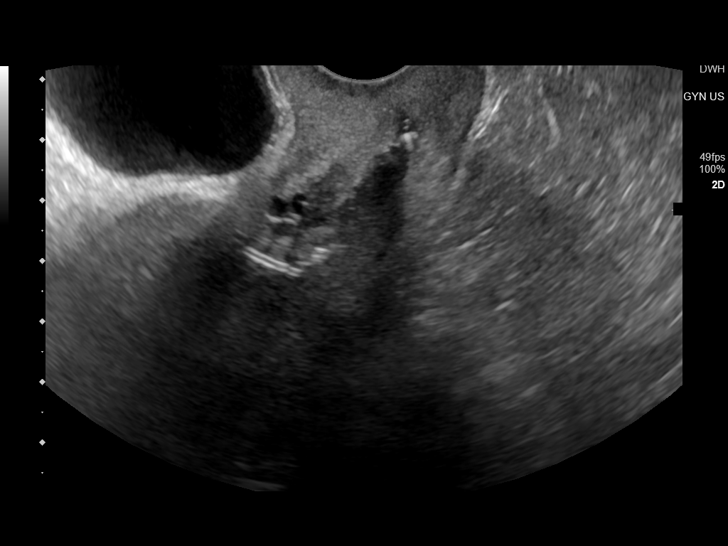

[13 of 25 positions shown; findings below may reference images not displayed]

FINDINGS: Uterus

Measurements: 10.3 x 3.6 x 6.1 cm = volume: 118.6 mL. No fibroids or
other mass visualized.

Endometrium

Thickness: 3.8 mm. No focal abnormality visualized. IUD in place at
the level of the lower uterine segment/cervix. IUD appears
malpositioned with 1 of the arms extending into the myometrium,
corresponding with abnormality on prior CT.

Right ovary

Measurements: 5.0 x 3.6 x 3.8 cm = volume: 35.8 mL. Right ovary is
not well visualized due to body habitus. Complex cyst and/or
adjacent cysts measuring up to approximately 5 cm in total seen
involving the right ovary, indeterminate. Unclear whether this
finding reflects a single complex septated cyst versus 2 adjacent
cysts. Difficult to delineate ovarian tissue from adjacent cyst.
Scattered areas of vascularity suspected to lie within the ovary
itself, although difficult to be certain.

Left ovary

Surgically absent.  No adnexal mass.

Pulsed Doppler evaluation of the right ovary demonstrates normal
low-resistance arterial and venous waveforms.

Other findings

No abnormal free fluid.
IMPRESSION: 1. Complex septated cyst versus 2 adjacent cysts involving the right
ovary, measuring up to approximately 5 cm in total dimension,
indeterminate. Follow-up ultrasound in 6-12 weeks recommended for
further evaluation.
2. No evidence for right ovarian torsion.
3. Malpositioned IUD at the level of the lower uterine
segment/cervix, with extension into the myometrium, corresponding
with findings seen on prior CT.
4. Prior left oophorectomy.

## 2021-01-29 IMAGING — CR DG ABDOMEN 1V
1 series · 2 of 2 positions shown · non-contrast
Comparison: CT abdomen pelvis 09/04/2019

CLINICAL DATA: Right side flank pain and hematuria x 2 weeks

EXAM:
ABDOMEN - 1 VIEW

[Series 1: t abdomen supine · 0.14mm/px · 2 of 2 slices shown]
[im 1/2]
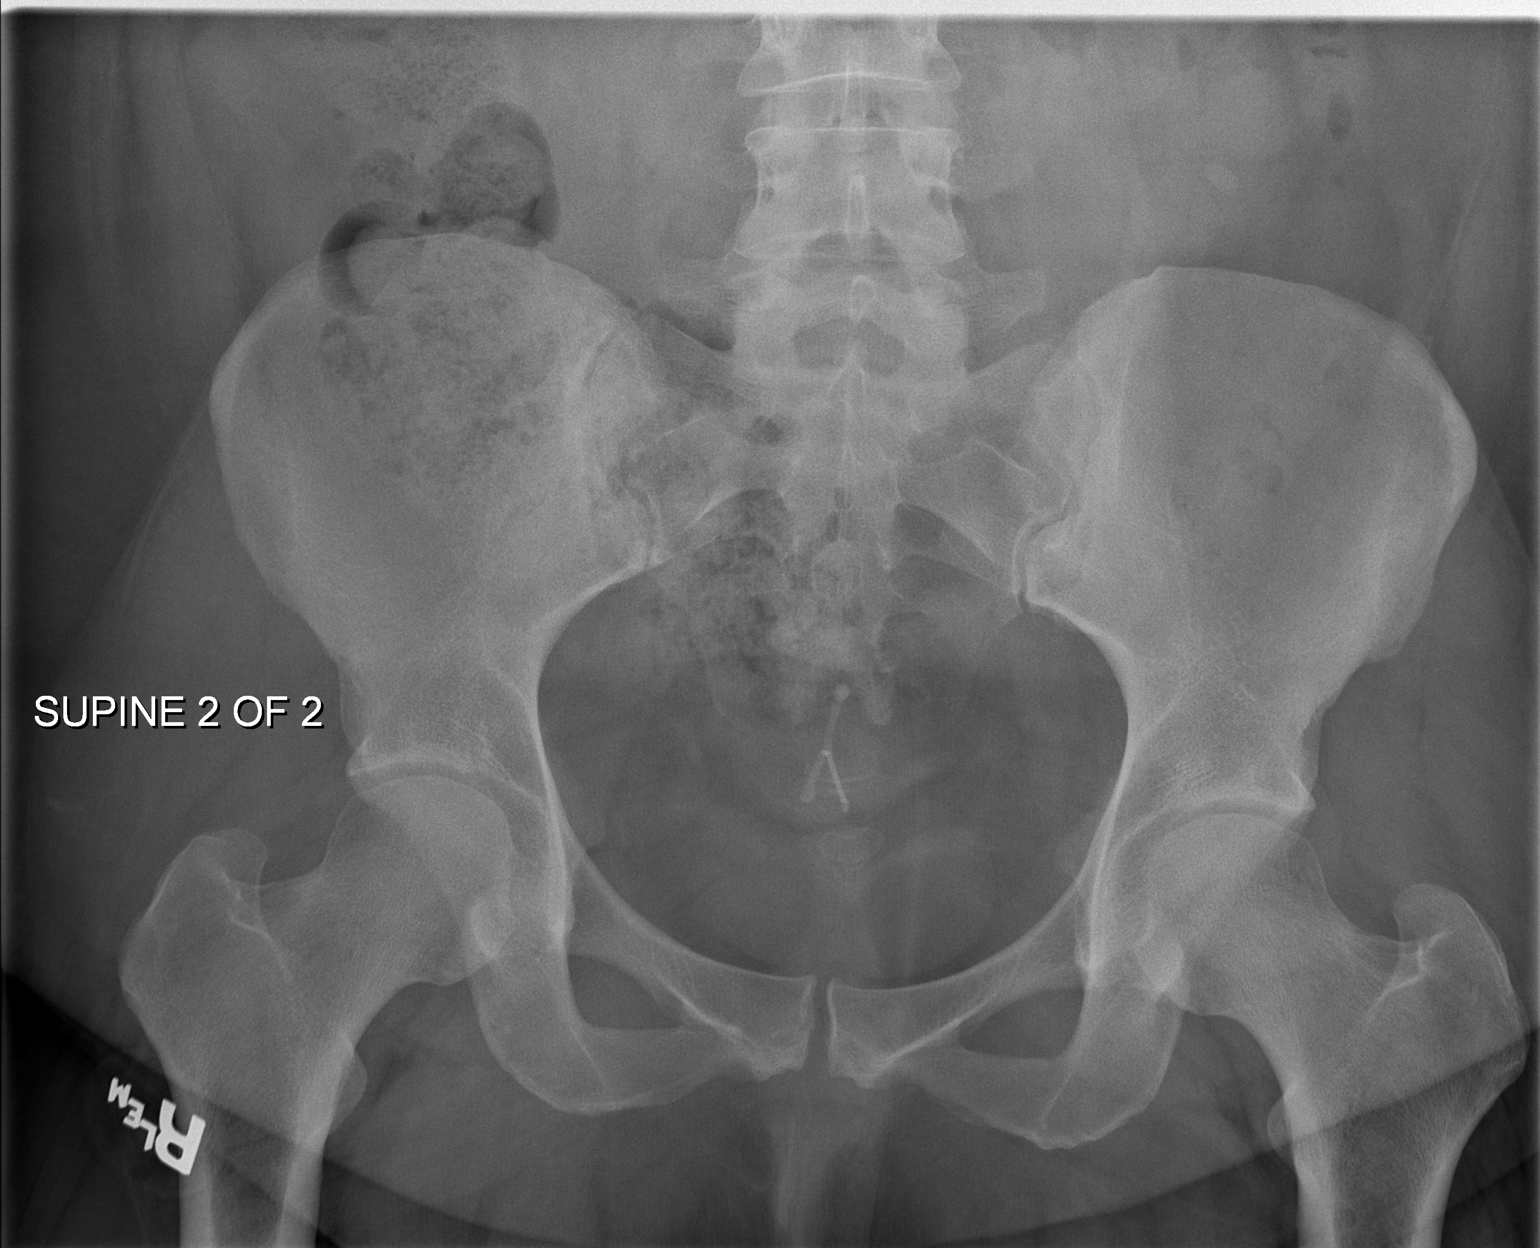
[im 2/2]
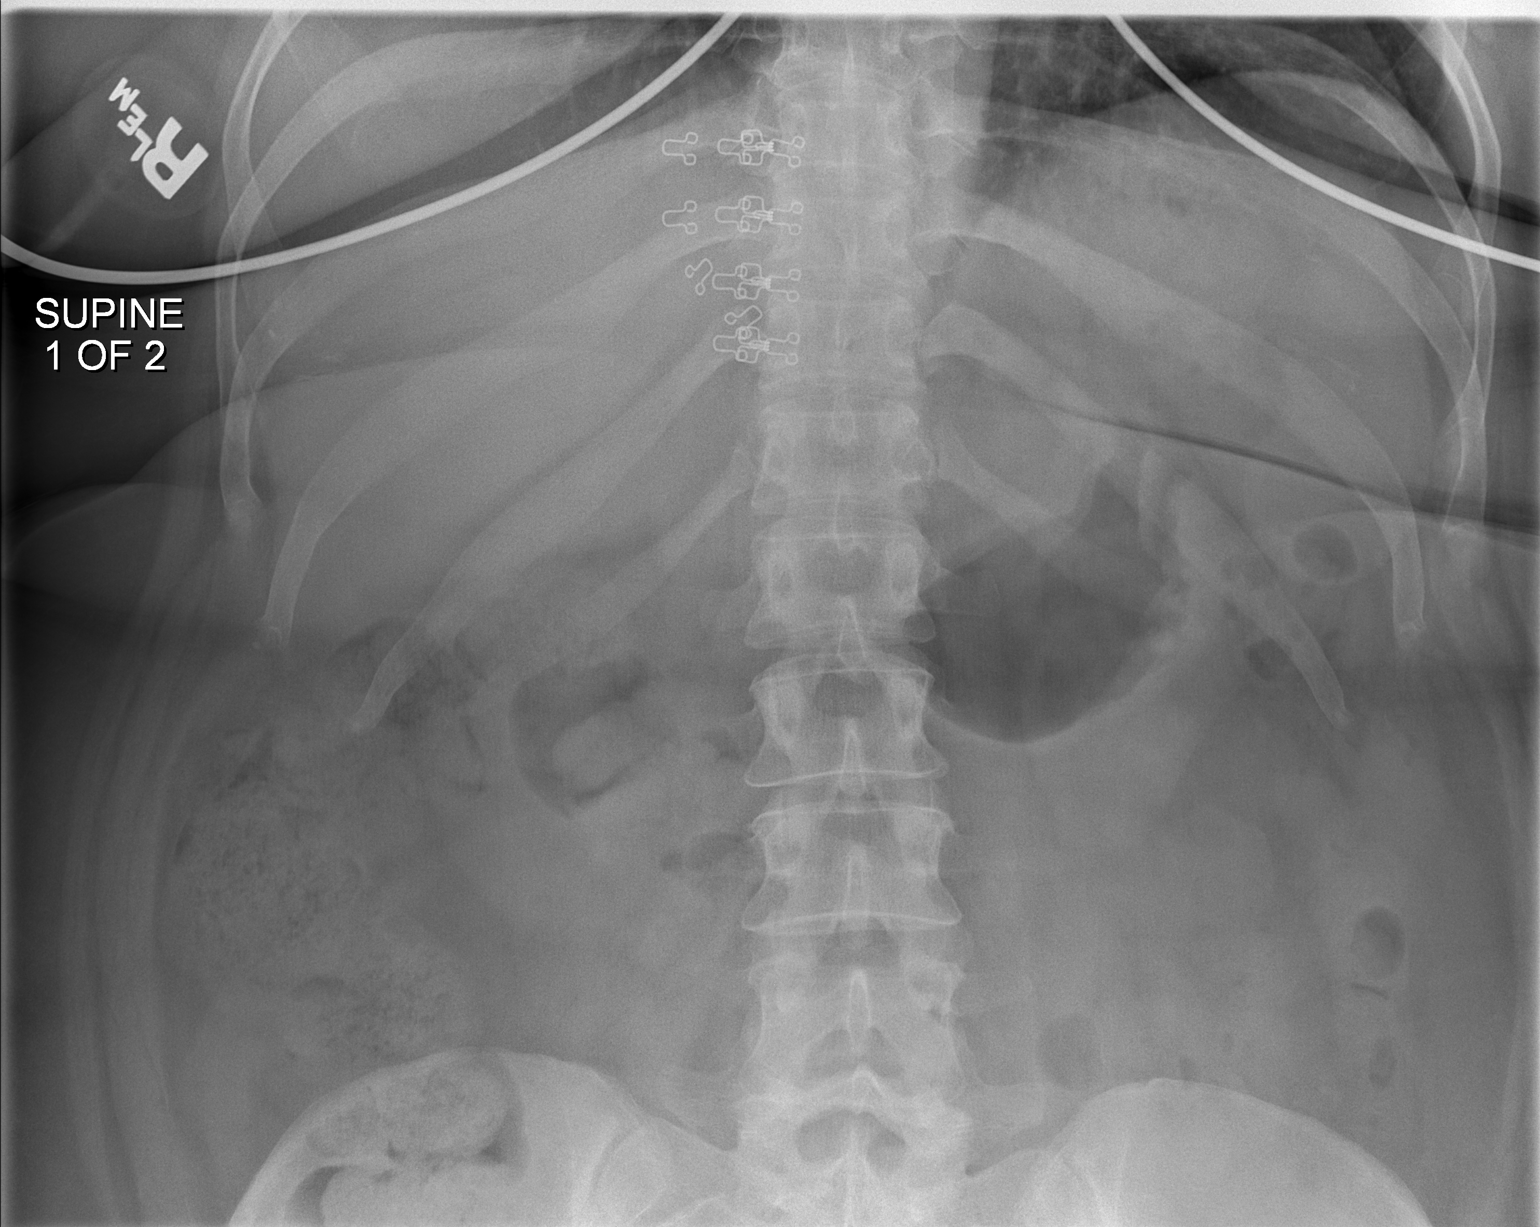

[2 of 2 positions shown; findings below may reference images not displayed]

FINDINGS: There is a very faint oval density in the right pelvis measuring
approximately 4 mm which is favored to represent the distal ureteral
stone seen on prior CT. No additional calcified densities identified
overlying the bilateral kidneys or ureters. IUD is visualized within
the pelvis.

The bowel gas pattern is nonobstructive.
IMPRESSION: Faint oval density in the right pelvis is favored to represent the
distal ureteral stone seen on prior CT, does not appear
significantly changed in position from prior.

## 2022-04-21 ENCOUNTER — Ambulatory Visit
Admission: EM | Admit: 2022-04-21 | Discharge: 2022-04-21 | Disposition: A | Discharge status: Home / Self Care | Payer: BLUE CROSS/BLUE SHIELD | Attending: Emergency Medicine | Admitting: Emergency Medicine

## 2022-04-21 DIAGNOSIS — J020 Streptococcal pharyngitis: Secondary | ICD-10-CM

## 2022-04-21 LAB — POCT RAPID STREP A (OFFICE): Rapid Strep A Screen: POSITIVE — AB

## 2022-04-21 MED ORDER — AMOXICILLIN 500 MG PO CAPS: 500.0000 mg | ORAL_CAPSULE | Freq: Two times a day (BID) | ORAL | 0 refills | Status: AC

## 2022-04-21 NOTE — ED Provider Notes (Signed)
Renaldo Fiddler    CSN: [id number] Arrival date & time: 04/21/22  1157      History   Chief Complaint Chief Complaint  Patient presents with   Sore Throat   Fatigue   Otalgia    HPI Stacy Richards is a 41 y.o. female.  Patient presents with 3-day history of sore throat, ear pain, fatigue.  No fever, rash, shortness of breath, vomiting, diarrhea, or other symptoms.  Treatment at home with Advil.  Her medical history includes hypertension.  The history is provided by the patient and medical records.    Past Medical History:  Diagnosis Date   Anxiety    Depression    GERD (gastroesophageal reflux disease)    History of cesarean section complicating pregnancy 02/17/2016   History of pre-eclampsia    HTN in pregnancy, chronic 02/17/2016   Hypertension    Kidney stone    S/P cesarean section 02/18/2016    Patient Active Problem List   Diagnosis Date Noted   Irregular periods 09/10/2019   Obesity 11/27/2018   Hyperlipidemia, unspecified 02/22/2017   S/P cesarean section 02/18/2016   HTN in pregnancy, chronic 02/17/2016   History of cesarean section complicating pregnancy 02/17/2016   Uterine scar from previous surgery affecting pregnancy 07/30/2015   Hypertension affecting pregnancy 07/30/2015   GERD (gastroesophageal reflux disease) 12/23/2014   Hypertension 12/23/2014   Depression 10/15/2013    Past Surgical History:  Procedure Laterality Date   CESAREAN SECTION     CESAREAN SECTION N/A 02/18/2016   Procedure: CESAREAN SECTION;  Surgeon: Sherian Rein, MD;  Location: [facility];  Service: Obstetrics;  Laterality: N/A;    OB History     Gravida  2   Para  2   Term  1   Preterm  1   AB      Living  3      SAB      IAB      Ectopic      Multiple  1   Live Births  3            Home Medications    Prior to Admission medications   Medication Sig Start Date End Date Taking? Authorizing Provider  amoxicillin (AMOXIL) 500  MG capsule Take 1 capsule (500 mg total) by mouth 2 (two) times daily for 10 days. 04/21/22 05/01/22 Yes Mickie Bail, NP  atorvastatin (LIPITOR) 10 MG tablet Take 10 mg by mouth daily. 08/29/19   [provider]  buPROPion (WELLBUTRIN XL) 300 MG 24 hr tablet Take 300 mg by mouth daily. 08/06/19   [provider]  esomeprazole (NEXIUM) 40 MG capsule Take 40 mg by mouth daily.  08/01/15   [provider]  HYDROcodone-acetaminophen (NORCO/VICODIN) 5-325 MG tablet Take 2 tablets by mouth every 6 (six) hours as needed for moderate pain or severe pain. 09/10/19   Sondra Come, MD  ibuprofen (ADVIL) 200 MG tablet Take 200 mg by mouth every 6 (six) hours as needed.    [provider]  levonorgestrel (MIRENA, 52 MG,) 20 MCG/24HR IUD Mirena 20 mcg/24 hours (6 yrs) 52 mg intrauterine device  Take 1 insert by intrauterine route.    [provider]  lisinopril (ZESTRIL) 20 MG tablet Take 20 mg by mouth daily. 08/29/19   [provider]  ondansetron (ZOFRAN ODT) 4 MG disintegrating tablet Allow 1-2 tablets to dissolve in your mouth every 8 hours as needed for nausea/vomiting 09/05/19   York Cerise,  Kandee Keen, MD  tamsulosin [facility]) 0.4 MG CAPS capsule Take 1 tablet by mouth daily until you pass the kidney stone or no longer have symptoms 09/05/19   Loleta Rose, MD  venlafaxine XR (EFFEXOR-XR) 150 MG 24 hr capsule Take 150 mg by mouth daily. 08/31/19   [provider]    Family History Family History  Problem Relation Age of Onset   Hypertension Mother    Hypertension Father    COPD Paternal Uncle        lung   Hypertension Maternal Grandmother    Skin cancer Maternal Grandmother    Hypertension Maternal Grandfather    Hypertension Paternal Grandmother    Skin cancer Paternal Grandmother    Hypertension Paternal Grandfather    COPD Paternal Grandfather        lung    Social History Social History   Tobacco Use   Smoking status: Never    Smokeless tobacco: Never  Substance Use Topics   Alcohol use: No   Drug use: No     Allergies   Patient has no known allergies.   Review of Systems Review of Systems  Constitutional:  Positive for fatigue. Negative for chills and fever.  HENT:  Positive for ear pain and sore throat.   Respiratory:  Negative for cough and shortness of breath.   Cardiovascular:  Negative for chest pain and palpitations.  Gastrointestinal:  Negative for diarrhea and vomiting.  Skin:  Negative for rash.  All other systems reviewed and are negative.    Physical Exam Triage Vital Signs ED Triage Vitals [04/21/22 1220]  Enc Vitals Group     BP      Pulse Rate (!) 101     Resp 18     Temp 97.9 F (36.6 C)     Temp src      SpO2 98 %     Weight      Height      Head Circumference      Peak Flow      Pain Score      Pain Loc      Pain Edu?      Excl. in GC?    No data found.  Updated Vital Signs BP 107/61   Pulse (!) 101   Temp 97.9 F (36.6 C)   Resp 18   Ht 5\' 5"  (1.651 m)   SpO2 98%   BMI 45.76 kg/m   Visual Acuity Right Eye Distance:   Left Eye Distance:   Bilateral Distance:    Right Eye Near:   Left Eye Near:    Bilateral Near:     Physical Exam Vitals and nursing note reviewed.  Constitutional:      General: She is not in acute distress.    Appearance: Normal appearance. She is well-developed. She is not ill-appearing.  HENT:     Right Ear: Tympanic membrane normal.     Left Ear: Tympanic membrane normal.     Nose: Nose normal.     Mouth/Throat:     Mouth: Mucous membranes are moist.     Pharynx: Posterior oropharyngeal erythema present.  Cardiovascular:     Rate and Rhythm: Normal rate and regular rhythm.     Heart sounds: Normal heart sounds.  Pulmonary:     Effort: Pulmonary effort is normal. No respiratory distress.     Breath sounds: Normal breath sounds.  Musculoskeletal:     Cervical back: Neck supple.  Skin:    General:  Skin is warm and dry.   Neurological:     Mental Status: She is alert.  Psychiatric:        Mood and Affect: Mood normal.        Behavior: Behavior normal.      UC Treatments / Results  Labs (all labs ordered are listed, but only abnormal results are displayed) Labs Reviewed  POCT RAPID STREP A (OFFICE) - Abnormal; Notable for the following components:      Result Value   Rapid Strep A Screen Positive (*)    All other components within normal limits    EKG   Radiology No results found.  Procedures Procedures (including critical care time)  Medications Ordered in UC Medications - No data to display  Initial Impression / Assessment and Plan / UC Course  I have reviewed the triage vital signs and the nursing notes.  Pertinent labs & imaging results that were available during my care of the patient were reviewed by me and considered in my medical decision making (see chart for details).    Strep pharyngitis.  Rapid strep positive.  Treating with amoxicillin.   Discussed symptomatic treatment including Tylenol or ibuprofen, rest, hydration.  Instructed patient to follow up with her PCP if symptoms are not improving.  She agrees to plan of care.   Final Clinical Impressions(s) / UC Diagnoses   Final diagnoses:  Strep pharyngitis     Discharge Instructions      Take the amoxicillin as directed for strep throat.  Follow up with your primary care provider if your symptoms are not improving.        ED Prescriptions     Medication Sig Dispense Auth. Provider   amoxicillin (AMOXIL) 500 MG capsule Take 1 capsule (500 mg total) by mouth 2 (two) times daily for 10 days. 20 capsule Mickie Bail, NP      PDMP not reviewed this encounter.   Mickie Bail, NP 04/21/22 1257

## 2022-04-21 NOTE — ED Triage Notes (Signed)
Patient to Urgent Care with complaints of sore throat, fatigue, and left ear pain. Some nasal congestion. Denies any known fevers.   Symptoms started Sunday.

## 2022-04-21 NOTE — Discharge Instructions (Addendum)
Take the amoxicillin as directed for strep throat.    Follow up with your primary care provider if your symptoms are not improving.    

## 2023-12-16 ENCOUNTER — Telehealth: Admitting: Physician Assistant

## 2023-12-16 DIAGNOSIS — R3989 Other symptoms and signs involving the genitourinary system: Secondary | ICD-10-CM

## 2023-12-16 MED ORDER — NITROFURANTOIN MONOHYD MACRO 100 MG PO CAPS: 100.0000 mg | ORAL_CAPSULE | Freq: Two times a day (BID) | ORAL | 0 refills | Status: AC

## 2023-12-16 NOTE — Patient Instructions (Signed)
 Samanta C Spegal, thank you for joining [name], PA-C for today's virtual visit.  While this provider is not your primary care provider (PCP), if your PCP is located in our provider database this encounter information will be shared with them immediately following your visit.   A [facility] MyChart account gives you access to today's visit and all your visits, tests, and labs performed at [facility]  click here if you don't have a [facility] MyChart account or go to [email or web address]  Consent: (Patient) Stacy Richards provided verbal consent for this virtual visit at the beginning of the encounter.  Current Medications:  Current Outpatient Medications:    nitrofurantoin, macrocrystal-monohydrate, (MACROBID) 100 MG capsule, Take 1 capsule (100 mg total) by mouth 2 (two) times daily., Disp: 10 capsule, Rfl: 0   atorvastatin (LIPITOR) 10 MG tablet, Take 10 mg by mouth daily., Disp: , Rfl:    buPROPion (WELLBUTRIN XL) 300 MG 24 hr tablet, Take 300 mg by mouth daily., Disp: , Rfl:    esomeprazole (NEXIUM) 40 MG capsule, Take 40 mg by mouth daily. , Disp: , Rfl:    HYDROcodone -acetaminophen  (NORCO/VICODIN) 5-325 MG tablet, Take 2 tablets by mouth every 6 (six) hours as needed for moderate pain or severe pain., Disp: 15 tablet, Rfl: 0   ibuprofen  (ADVIL ) 200 MG tablet, Take 200 mg by mouth every 6 (six) hours as needed., Disp: , Rfl:    levonorgestrel (MIRENA, 52 MG,) 20 MCG/24HR IUD, Mirena 20 mcg/24 hours (6 yrs) 52 mg intrauterine device  Take 1 insert by intrauterine route., Disp: , Rfl:    lisinopril (ZESTRIL) 20 MG tablet, Take 20 mg by mouth daily., Disp: , Rfl:    ondansetron  (ZOFRAN  ODT) 4 MG disintegrating tablet, Allow 1-2 tablets to dissolve in your mouth every 8 hours as needed for nausea/vomiting, Disp: 30 tablet, Rfl: 0   tamsulosin  (FLOMAX ) 0.4 MG CAPS capsule, Take 1 tablet by mouth daily until you pass the kidney stone or no longer have symptoms, Disp: 14  capsule, Rfl: 0   venlafaxine  XR (EFFEXOR -XR) 150 MG 24 hr capsule, Take 150 mg by mouth daily., Disp: , Rfl:    Medications ordered in this encounter:  Meds ordered this encounter  Medications   nitrofurantoin, macrocrystal-monohydrate, (MACROBID) 100 MG capsule    Sig: Take 1 capsule (100 mg total) by mouth 2 (two) times daily.    Dispense:  10 capsule    Refill:  0    Supervising Provider:   BLAISE ALEENE KIDD [[id number]]     *If you need refills on other medications prior to your next appointment, please contact your pharmacy*  Follow-Up: Call back or seek an in-person evaluation if the symptoms worsen or if the condition fails to improve as anticipated.  [facility] [phone]  Other Instructions Urinary Tract Infection, Female A urinary tract infection (UTI) is an infection in your urinary tract. The urinary tract is made up of organs that make, store, and get rid of pee (urine) in your body. These organs include: The kidneys. The ureters. The bladder. The urethra. What are the causes? Most UTIs are caused by germs called bacteria. They may be in or near your genitals. These germs grow and cause swelling in your urinary tract. What increases the risk? You're more likely to get a UTI if: You're a female. The urethra is shorter in females than in males. You have a soft tube called a catheter that drains your pee. You  can't control when you pee or poop. You have trouble peeing because of: A kidney stone. A urinary blockage. A nerve condition that affects your bladder. Not getting enough to drink. You're sexually active. You use a birth control inside your vagina, like spermicide. You're pregnant. You have low levels of the hormone estrogen in your body. You're an older adult. You're also more likely to get a UTI if you have other health problems. These may include: Diabetes. A weak immune system. Your immune system is your body's defense system. Sickle  cell disease. Injury of the spine. What are the signs or symptoms? Symptoms may include: Needing to pee right away. Peeing small amounts often. Pain or burning when you pee. Blood in your pee. Pee that smells bad or odd. Pain in your belly or lower back. You may also: Feel confused. This may be the first symptom in older adults. Vomit. Not feel hungry. Feel tired or easily annoyed. Have a fever or chills. How is this diagnosed? A UTI is diagnosed based on your medical history and an exam. You may also have other tests. These may include: Pee tests. Blood tests. Tests for sexually transmitted infections (STIs). If you've had more than one UTI, you may need to have imaging studies done to find out why you keep getting them. How is this treated? A UTI can be treated by: Taking antibiotics or other medicines. Drinking enough fluid to keep your pee pale yellow. In rare cases, a UTI can cause a very bad condition called sepsis. Sepsis may be treated in the hospital. Follow these instructions at home: Medicines Take your medicines only as told by your health care provider. If you were given antibiotics, take them as told by your provider. Do not stop taking them even if you start to feel better. General instructions Make sure you: Pee often and fully. Do not hold your pee for a long time. Wipe from front to back after you pee or poop. Use each tissue only once when you wipe. Pee after you have sex. Do not douche or use sprays or powders in your genital area. Contact a health care provider if: Your symptoms don't get better after 1-2 days of taking antibiotics. Your symptoms go away and then come back. You have a fever or chills. You vomit or feel like you may vomit. Get help right away if: You have very bad pain in your back or lower belly. You faint. This information is not intended to replace advice given to you by your health care provider. Make sure you discuss any questions  you have with your health care provider. Document Revised: 04/13/2023 Document Reviewed: 08/06/2022 Elsevier Patient Education  [employer].   If you have been instructed to have an in-person evaluation today at a local Urgent Care facility, please use the link below. It will take you to a list of all of our available [facility], including address, phone number and hours of operation. Please do not delay care.  [facility]  If you or a family member do not have a primary care provider, use the link below to schedule a visit and establish care. When you choose a [facility] primary care physician or advanced practice provider, you gain a long-term partner in health. Find a Primary Care Provider  Learn more about [facility]'s in-office and virtual care options: [facility] - Get Care Now

## 2023-12-16 NOTE — Progress Notes (Signed)
 Virtual Visit Consent   Stacy Richards, you are scheduled for a virtual visit with a [facility] provider today. Just as with appointments in the office, your consent must be obtained to participate. Your consent will be active for this visit and any virtual visit you may have with one of our providers in the next 365 days. If you have a MyChart account, a copy of this consent can be sent to you electronically.  As this is a virtual visit, video technology does not allow for your provider to perform a traditional examination. This may limit your provider's ability to fully assess your condition. If your provider identifies any concerns that need to be evaluated in person or the need to arrange testing (such as labs, EKG, etc.), we will make arrangements to do so. Although advances in technology are sophisticated, we cannot ensure that it will always work on either your end or our end. If the connection with a video visit is poor, the visit may have to be switched to a telephone visit. With either a video or telephone visit, we are not always able to ensure that we have a secure connection.  By engaging in this virtual visit, you consent to the provision of healthcare and authorize for your insurance to be billed (if applicable) for the services provided during this visit. Depending on your insurance coverage, you may receive a charge related to this service.  I need to obtain your verbal consent now. Are you willing to proceed with your visit today? Stacy Richards has provided verbal consent on 12/16/2023 for a virtual visit (video or telephone). Delon Stacy Dickinson, PA-C  Date: 12/16/2023 12:46 PM   Virtual Visit via Video Note   I, Delon Stacy Richards, connected with  Stacy Richards  ([id number], 18-Sep-1980) on 12/16/23 at 12:45 PM EDT by a video-enabled telemedicine application and verified that I am speaking with the correct person using two identifiers.  Location: Patient: Virtual Visit Location  Patient: Home Provider: Virtual Visit Location Provider: Home Office   I discussed the limitations of evaluation and management by telemedicine and the availability of in person appointments. The patient expressed understanding and agreed to proceed.    History of Present Illness: Stacy Richards is a 43 y.o. who identifies as a female who was assigned female at birth, and is being seen today for dysuria.  HPI: Urinary Tract Infection  This is a new problem. The current episode started in the past 7 days (2 days). The problem occurs every urination. The problem has been gradually worsening. The quality of the pain is described as burning. The pain is mild. There has been no fever. Associated symptoms include frequency, hesitancy, nausea and urgency. Pertinent negatives include no chills, discharge, flank pain, hematuria, possible pregnancy, sweats or vomiting. Associated symptoms comments: Low back ache, malaise. She has tried increased fluids (Urostat, cranberry juice) for the symptoms. The treatment provided no relief.    Took an at home UTI test and was positive for leukocytes  Problems:  Patient Active Problem List   Diagnosis Date Noted   Irregular periods 09/10/2019   Obesity 11/27/2018   Hyperlipidemia, unspecified 02/22/2017   S/P cesarean section 02/18/2016   HTN in pregnancy, chronic 02/17/2016   History of cesarean section complicating pregnancy 02/17/2016   Uterine scar from previous surgery affecting pregnancy 07/30/2015   Hypertension affecting pregnancy 07/30/2015   GERD (gastroesophageal reflux disease) 12/23/2014   Hypertension 12/23/2014   Depression 10/15/2013  Allergies: No Known Allergies Medications:  Current Outpatient Medications:    nitrofurantoin, macrocrystal-monohydrate, (MACROBID) 100 MG capsule, Take 1 capsule (100 mg total) by mouth 2 (two) times daily., Disp: 10 capsule, Rfl: 0   atorvastatin (LIPITOR) 10 MG tablet, Take 10 mg by mouth daily., Disp: ,  Rfl:    buPROPion (WELLBUTRIN XL) 300 MG 24 hr tablet, Take 300 mg by mouth daily., Disp: , Rfl:    esomeprazole (NEXIUM) 40 MG capsule, Take 40 mg by mouth daily. , Disp: , Rfl:    HYDROcodone -acetaminophen  (NORCO/VICODIN) 5-325 MG tablet, Take 2 tablets by mouth every 6 (six) hours as needed for moderate pain or severe pain., Disp: 15 tablet, Rfl: 0   ibuprofen  (ADVIL ) 200 MG tablet, Take 200 mg by mouth every 6 (six) hours as needed., Disp: , Rfl:    levonorgestrel (MIRENA, 52 MG,) 20 MCG/24HR IUD, Mirena 20 mcg/24 hours (6 yrs) 52 mg intrauterine device  Take 1 insert by intrauterine route., Disp: , Rfl:    lisinopril (ZESTRIL) 20 MG tablet, Take 20 mg by mouth daily., Disp: , Rfl:    ondansetron  (ZOFRAN  ODT) 4 MG disintegrating tablet, Allow 1-2 tablets to dissolve in your mouth every 8 hours as needed for nausea/vomiting, Disp: 30 tablet, Rfl: 0   tamsulosin  (FLOMAX ) 0.4 MG CAPS capsule, Take 1 tablet by mouth daily until you pass the kidney stone or no longer have symptoms, Disp: 14 capsule, Rfl: 0   venlafaxine  XR (EFFEXOR -XR) 150 MG 24 hr capsule, Take 150 mg by mouth daily., Disp: , Rfl:   Observations/Objective: Patient is well-developed, well-nourished in no acute distress.  Resting comfortably at home.  Head is normocephalic, atraumatic.  No labored breathing.  Speech is clear and coherent with logical content.  Patient is alert and oriented at baseline.    Assessment and Plan: 1. Suspected UTI (Primary) - nitrofurantoin, macrocrystal-monohydrate, (MACROBID) 100 MG capsule; Take 1 capsule (100 mg total) by mouth 2 (two) times daily.  Dispense: 10 capsule; Refill: 0  - Worsening symptoms.  - Will treat empirically with Macrobid - May use AZO for bladder spasms - Continue to push fluids.  - Seek in person evaluation for urine culture if symptoms do not improve or if they worsen.    Follow Up Instructions: I discussed the assessment and treatment plan with the patient. The  patient was provided an opportunity to ask questions and all were answered. The patient agreed with the plan and demonstrated an understanding of the instructions.  A copy of instructions were sent to the patient via MyChart unless otherwise noted below.    The patient was advised to call back or seek an in-person evaluation if the symptoms worsen or if the condition fails to improve as anticipated.    Delon Stacy Dickinson, PA-C
